# Patient Record
Sex: Male | Born: 1970 | Race: White | Hispanic: No | Marital: Single | Smoking: Former smoker
Health system: Southern US, Community
[De-identification: ages and names within clinical notes are randomized; demographics above are authoritative.]

## PROBLEM LIST (undated history)

## (undated) DIAGNOSIS — J45909 Unspecified asthma, uncomplicated: Secondary | ICD-10-CM

## (undated) DIAGNOSIS — I499 Cardiac arrhythmia, unspecified: Secondary | ICD-10-CM

## (undated) DIAGNOSIS — I498 Other specified cardiac arrhythmias: Secondary | ICD-10-CM

## (undated) HISTORY — DX: Unspecified asthma, uncomplicated: J45.909

## (undated) HISTORY — DX: Cardiac arrhythmia, unspecified: I49.9

## (undated) HISTORY — DX: Other specified cardiac arrhythmias: I49.8

---

## 2017-11-24 ENCOUNTER — Ambulatory Visit (INDEPENDENT_AMBULATORY_CARE_PROVIDER_SITE_OTHER): Payer: Self-pay | Admitting: Sports Medicine

## 2017-11-24 ENCOUNTER — Encounter: Payer: Self-pay | Admitting: Sports Medicine

## 2017-11-24 DIAGNOSIS — J45909 Unspecified asthma, uncomplicated: Secondary | ICD-10-CM | POA: Insufficient documentation

## 2017-11-24 DIAGNOSIS — I498 Other specified cardiac arrhythmias: Secondary | ICD-10-CM | POA: Insufficient documentation

## 2017-11-24 DIAGNOSIS — Z0000 Encounter for general adult medical examination without abnormal findings: Secondary | ICD-10-CM | POA: Insufficient documentation

## 2017-11-24 DIAGNOSIS — R0989 Other specified symptoms and signs involving the circulatory and respiratory systems: Secondary | ICD-10-CM

## 2017-11-24 DIAGNOSIS — I499 Cardiac arrhythmia, unspecified: Secondary | ICD-10-CM

## 2017-11-24 DIAGNOSIS — J989 Respiratory disorder, unspecified: Secondary | ICD-10-CM

## 2017-11-24 NOTE — Assessment & Plan Note (Signed)
Checking routine labs.  Unremarkable physical.

## 2017-11-24 NOTE — Assessment & Plan Note (Signed)
Historical, decreased caffeine intake and life stressors and this resolved. Did have an echocardiogram showing a structurally normal heart, off of metoprolol now.

## 2017-11-24 NOTE — Assessment & Plan Note (Signed)
Typically irritant related, has some albuterol for use as needed.

## 2017-11-24 NOTE — Progress Notes (Signed)
Subjective:    CC: New patient visit with the below complaints as noted in HPI:  HPI:  Samuel Fox is a pleasant 47 year old male, he is here to establish care, he really has no complaints.  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  He was noted to have a structurally normal heart on echocardiogram, and symptoms resolved with decreasing stimulant intake.  He has occasional irritant reactive airways and uses albuterol rarely, otherwise no complaints.  I reviewed the past medical history, family history, social history, surgical history, and allergies today and no changes were needed.  Please see the problem list section below in epic for further details.  Past Medical History: Past Medical History:  Diagnosis Date  . Asthma   . Ventricular bigeminy    Past Surgical History: History reviewed. No pertinent surgical history. Social History: Social History   Socioeconomic History  . Marital status: Single    Spouse name: Not on file  . Number of children: Not on file  . Years of education: Not on file  . Highest education level: Not on file  Occupational History  . Not on file  Social Needs  . Financial resource strain: Not on file  . Food insecurity:    Worry: Not on file    Inability: Not on file  . Transportation needs:    Medical: Not on file    Non-medical: Not on file  Tobacco Use  . Smoking status: Former Games developermoker  . Smokeless tobacco: Never Used  Substance and Sexual Activity  . Alcohol use: Yes  . Drug use: Never  . Sexual activity: Yes  Lifestyle  . Physical activity:    Days per week: Not on file    Minutes per session: Not on file  . Stress: Not on file  Relationships  . Social connections:    Talks on phone: Not on file    Gets together: Not on file    Attends religious service: Not on file    Active member of club or organization: Not on file    Attends meetings of clubs or organizations:  Not on file    Relationship status: Not on file  Other Topics Concern  . Not on file  Social History Narrative  . Not on file   Family History: No family history on file. Allergies: No Known Allergies Medications: See med rec.  Review of Systems: No headache, visual changes, nausea, vomiting, diarrhea, constipation, dizziness, abdominal pain, skin rash, fevers, chills, night sweats, swollen lymph nodes, weight loss, chest pain, body aches, joint swelling, muscle aches, shortness of breath, mood changes, visual or auditory hallucinations.  Objective:    General: Well Developed, well nourished, and in no acute distress.  Neuro: Alert and oriented x3, extra-ocular muscles intact, sensation grossly intact. Cranial nerves II through XII are intact, motor, sensory, and coordinative functions are all intact. HEENT: Normocephalic, atraumatic, pupils equal round reactive to light, neck supple, no masses, no lymphadenopathy, thyroid nonpalpable. Oropharynx, nasopharynx, external ear canals are unremarkable. Skin: Warm and dry, no rashes noted.  Cardiac: Regular rate and rhythm, no murmurs rubs or gallops.  Respiratory: Clear to auscultation bilaterally. Not using accessory muscles, speaking in full sentences.  Abdominal: Soft, nontender, nondistended, positive bowel sounds, no masses, no organomegaly.  Musculoskeletal: Shoulder, elbow, wrist, hip, knee, ankle stable, and with full range of motion.  Impression and Recommendations:    The patient was counselled, risk factors were  discussed, anticipatory guidance given.  History of Ventricular bigeminy Historical, decreased caffeine intake and life stressors and this resolved. Did have an echocardiogram showing a structurally normal heart, off of metoprolol now.  Annual physical exam Checking routine labs.  Unremarkable physical.  Reactive airway disease that is not asthma Typically irritant related, has some albuterol for use as  needed. ___________________________________________ Ihor Austinhomas J. Benjamin Stainhekkekandam, M.D., ABFM., CAQSM. Primary Care and Sports Medicine [facility]  Adjunct Instructor of Family Medicine  [employer]

## 2018-02-13 ENCOUNTER — Other Ambulatory Visit: Payer: Self-pay | Admitting: *Deleted

## 2018-02-13 ENCOUNTER — Other Ambulatory Visit: Payer: Self-pay | Admitting: Sports Medicine

## 2018-04-22 ENCOUNTER — Other Ambulatory Visit: Payer: Self-pay | Admitting: Sports Medicine

## 2018-05-11 ENCOUNTER — Ambulatory Visit (INDEPENDENT_AMBULATORY_CARE_PROVIDER_SITE_OTHER): Payer: BLUE CROSS/BLUE SHIELD | Admitting: Physician Assistant

## 2018-05-11 ENCOUNTER — Encounter: Payer: Self-pay | Admitting: Physician Assistant

## 2018-05-11 VITALS — BP 144/91 | HR 66 | Wt 189.0 lb

## 2018-05-11 DIAGNOSIS — Z20818 Contact with and (suspected) exposure to other bacterial communicable diseases: Secondary | ICD-10-CM

## 2018-05-11 NOTE — Progress Notes (Signed)
HPI:                                                                Samuel Fox is a 48 y.o. male who presents to [facility]: Primary Care Sports Medicine today for exposure to MRSA  Reports friend was diagnosed with MRSA skin infection of abscess his neck. He states he had close contact with this friend, including contact with his bandages. He denies any symptoms.   Past Medical History:  Diagnosis Date  . Asthma   . Ventricular bigeminy    No past surgical history on file. Social History   Tobacco Use  . Smoking status: Former Games developer  . Smokeless tobacco: Never Used  Substance Use Topics  . Alcohol use: Yes   family history is not on file.    ROS: Review of Systems  Constitutional: Negative for chills, fever and malaise/fatigue.  Skin: Negative for rash.  All other systems reviewed and are negative.    Medications: Current Outpatient Medications  Medication Sig Dispense Refill  . albuterol (PROVENTIL HFA;VENTOLIN HFA) 108 (90 Base) MCG/ACT inhaler INHALE 2 PUFFS EVERY 6 HRS AS NEEDED FOR WHEEZING OR SHORTNESS OF BREATH. 8.5 Inhaler 2  . b complex vitamins tablet Take 1 tablet by mouth daily.    . Misc Natural Products (GLUCOSAMINE CHOND MSM FORMULA PO) Take by mouth.    . Multiple Vitamin (MULTIVITAMIN) tablet Take 1 tablet by mouth daily.     No current facility-administered medications for this visit.    No Known Allergies     Objective:  BP (!) 144/91   Pulse 66   Wt 189 lb (85.7 kg)   BMI 25.63 kg/m  Gen:  alert, not ill-appearing, no distress, appropriate for age   No results found for this or any previous visit (from the past 72 hour(s)). No results found.    Assessment and Plan: 48 y.o. male with   .Diagnoses and all orders for this visit:  Exposure to MRSA  Asymptomatic Reassurance Counseled on infection prevention   Patient education and anticipatory guidance given Patient agrees with treatment  plan Follow-up as needed if symptoms worsen or fail to improve  Levonne Hubert PA-C

## 2018-05-17 ENCOUNTER — Encounter: Payer: Self-pay | Admitting: Physician Assistant

## 2018-06-04 ENCOUNTER — Ambulatory Visit: Payer: BLUE CROSS/BLUE SHIELD | Admitting: Sports Medicine

## 2018-06-04 ENCOUNTER — Ambulatory Visit (INDEPENDENT_AMBULATORY_CARE_PROVIDER_SITE_OTHER): Payer: BLUE CROSS/BLUE SHIELD

## 2018-06-04 ENCOUNTER — Encounter: Payer: Self-pay | Admitting: Sports Medicine

## 2018-06-04 DIAGNOSIS — M25562 Pain in left knee: Secondary | ICD-10-CM | POA: Insufficient documentation

## 2018-06-04 DIAGNOSIS — G8929 Other chronic pain: Secondary | ICD-10-CM | POA: Diagnosis not present

## 2018-06-04 DIAGNOSIS — M170 Bilateral primary osteoarthritis of knee: Secondary | ICD-10-CM | POA: Diagnosis not present

## 2018-06-04 DIAGNOSIS — L409 Psoriasis, unspecified: Secondary | ICD-10-CM | POA: Diagnosis not present

## 2018-06-04 MED ORDER — CLOBETASOL PROPIONATE 0.05 % EX CREA: 1.0000 "application " | TOPICAL_CREAM | Freq: Two times a day (BID) | CUTANEOUS | 2 refills | Status: DC

## 2018-06-04 NOTE — Progress Notes (Addendum)
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  Several months ago he twisted his knee, he had swelling, and pain at the medial joint line.  He wore a brace, iced it and took some NSAIDs and symptoms improved.  Now he gets occasional catching, locking, pain and swelling.  Does not really trust the knee.  Symptoms are medial joint line, localized without radiation.  In addition he has had a rash on his anterior left shin, it has been on his elbows.  Highly pruritic.  No pain.  I reviewed the past medical history, family history, social history, surgical history, and allergies today and no changes were needed.  Please see the problem list section below in epic for further details.  Past Medical History: Past Medical History:  Diagnosis Date  . Asthma   . Ventricular bigeminy    Past Surgical History: No past surgical history on file. Social History: Social History   Socioeconomic History  . Marital status: Single    Spouse name: Not on file  . Number of children: Not on file  . Years of education: Not on file  . Highest education level: Not on file  Occupational History  . Not on file  Social Needs  . Financial resource strain: Not on file  . Food insecurity:    Worry: Not on file    Inability: Not on file  . Transportation needs:    Medical: Not on file    Non-medical: Not on file  Tobacco Use  . Smoking status: Former Games developer  . Smokeless tobacco: Never Used  Substance and Sexual Activity  . Alcohol use: Yes  . Drug use: Never  . Sexual activity: Yes  Lifestyle  . Physical activity:    Days per week: Not on file    Minutes per session: Not on file  . Stress: Not on file  Relationships  . Social connections:    Talks on phone: Not on file    Gets together: Not on file    Attends religious service: Not on file    Active member of club or organization: Not on file    Attends meetings of clubs  or organizations: Not on file    Relationship status: Not on file  Other Topics Concern  . Not on file  Social History Narrative  . Not on file   Family History: No family history on file. Allergies: No Known Allergies Medications: See med rec.  Review of Systems: No fevers, chills, night sweats, weight loss, chest pain, or shortness of breath.   Objective:    General: Well Developed, well nourished, and in no acute distress.  Neuro: Alert and oriented x3, extra-ocular muscles intact, sensation grossly intact.  HEENT: Normocephalic, atraumatic, pupils equal round reactive to light, neck supple, no masses, no lymphadenopathy, thyroid nonpalpable.  Skin: Warm and dry, psoriasic form rash on the left anterior shin. Cardiac: Regular rate and rhythm, no murmurs rubs or gallops, no lower extremity edema.  Respiratory: Clear to auscultation bilaterally. Not using accessory muscles, speaking in full sentences. Left knee: Normal to inspection with no erythema or effusion or obvious bony abnormalities. Palpation normal with no warmth or joint line tenderness or patellar tenderness or condyle tenderness. ROM normal in flexion and extension and lower leg rotation. Pain with terminal flexion Ligaments with solid consistent endpoints including ACL, PCL, LCL, MCL. Positive Mcmurray's and provocative meniscal tests. Non  painful patellar compression. Patellar and quadriceps tendons unremarkable. Hamstring and quadriceps strength is normal.  Impression and Recommendations:    Left medial knee pain Suspect medial meniscal tear. X-rays, MRI. Questioning he has injected his own knee with horse steroid in the past without much improvement.  MRI shows large medial meniscal tear, para meniscal cyst. Referral to Dr. Everardo Pacific.  He has injected his own knee in an unguided fashion with horse steroid in the recent past without any improvement.  Psoriasis Adding topical clobetasol, if insufficient  relief we will switch to calcipotriene. ___________________________________________ Ihor Austin. Benjamin Stain, M.D., ABFM., CAQSM. Primary Care and Sports Medicine [facility]  Adjunct Professor of Family Medicine  [employer]

## 2018-06-04 NOTE — Assessment & Plan Note (Signed)
Adding topical clobetasol, if insufficient relief we will switch to calcipotriene.

## 2018-06-04 NOTE — Assessment & Plan Note (Addendum)
Suspect medial meniscal tear. X-rays, MRI. Questioning he has injected his own knee with horse steroid in the past without much improvement.  MRI shows large medial meniscal tear, para meniscal cyst. Referral to Dr. Everardo Pacific.  He has injected his own knee in an unguided fashion with horse steroid in the recent past without any improvement.

## 2018-06-12 ENCOUNTER — Ambulatory Visit: Payer: BLUE CROSS/BLUE SHIELD | Admitting: Sports Medicine

## 2018-06-12 ENCOUNTER — Encounter: Payer: Self-pay | Admitting: Sports Medicine

## 2018-06-12 DIAGNOSIS — N529 Male erectile dysfunction, unspecified: Secondary | ICD-10-CM | POA: Diagnosis not present

## 2018-06-12 DIAGNOSIS — G8929 Other chronic pain: Secondary | ICD-10-CM

## 2018-06-12 DIAGNOSIS — R51 Headache: Secondary | ICD-10-CM | POA: Diagnosis not present

## 2018-06-12 DIAGNOSIS — R519 Headache, unspecified: Secondary | ICD-10-CM

## 2018-06-12 DIAGNOSIS — H9312 Tinnitus, left ear: Secondary | ICD-10-CM

## 2018-06-12 DIAGNOSIS — H9319 Tinnitus, unspecified ear: Secondary | ICD-10-CM | POA: Insufficient documentation

## 2018-06-12 DIAGNOSIS — H6121 Impacted cerumen, right ear: Secondary | ICD-10-CM | POA: Insufficient documentation

## 2018-06-12 MED ORDER — SILDENAFIL CITRATE 50 MG PO TABS: 50.0000 mg | ORAL_TABLET | Freq: Every day | ORAL | 11 refills | Status: DC | PRN

## 2018-06-12 MED ORDER — FLUTICASONE PROPIONATE 50 MCG/ACT NA SUSP: NASAL | 3 refills | Status: DC

## 2018-06-12 NOTE — Assessment & Plan Note (Signed)
Removal with instrumentation.

## 2018-06-12 NOTE — Assessment & Plan Note (Signed)
Severe headache with left-sided tinnitus. Adding a brain MRI with and without contrast, need to rule out acoustic neuroma. I am also going to add some Flonase and get a referral to ENT.

## 2018-06-12 NOTE — Progress Notes (Signed)
Subjective:    CC: Several issues  HPI: Tinnitus: Has been complaining of a relatively long history of tinnitus in his left ear, associated headaches.  No trauma, no constitutional symptoms.  He has never had brain imaging.  No focal neurologic symptoms, no visual changes, no dysarthria.  Hearing loss in the right ear: Would like me to take a look.  Erectile dysfunction: As the years past he has noted decreases in both the quality and duration of his erections.'s are moderate, persistent.  I reviewed the past medical history, family history, social history, surgical history, and allergies today and no changes were needed.  Please see the problem list section below in epic for further details.  Past Medical History: Past Medical History:  Diagnosis Date  . Asthma   . Ventricular bigeminy    Past Surgical History: No past surgical history on file. Social History: Social History   Socioeconomic History  . Marital status: Single    Spouse name: Not on file  . Number of children: Not on file  . Years of education: Not on file  . Highest education level: Not on file  Occupational History  . Not on file  Social Needs  . Financial resource strain: Not on file  . Food insecurity:    Worry: Not on file    Inability: Not on file  . Transportation needs:    Medical: Not on file    Non-medical: Not on file  Tobacco Use  . Smoking status: Former Games developer  . Smokeless tobacco: Never Used  Substance and Sexual Activity  . Alcohol use: Yes  . Drug use: Never  . Sexual activity: Yes  Lifestyle  . Physical activity:    Days per week: Not on file    Minutes per session: Not on file  . Stress: Not on file  Relationships  . Social connections:    Talks on phone: Not on file    Gets together: Not on file    Attends religious service: Not on file    Active member of club or organization: Not on file    Attends meetings of clubs or organizations: Not on file    Relationship status:  Not on file  Other Topics Concern  . Not on file  Social History Narrative  . Not on file   Family History: No family history on file. Allergies: No Known Allergies Medications: See med rec.  Review of Systems: No fevers, chills, night sweats, weight loss, chest pain, or shortness of breath.   Objective:    General: Well Developed, well nourished, and in no acute distress.  Neuro: Alert and oriented x3, extra-ocular muscles intact, sensation grossly intact.  HEENT: Normocephalic, atraumatic, pupils equal round reactive to light, neck supple, no masses, no lymphadenopathy, thyroid nonpalpable.  Oropharynx, nasopharynx, ear canals unremarkable with the exception of the right ear canal which has an impacted piece of cerumen. Skin: Warm and dry, no rashes. Cardiac: Regular rate and rhythm, no murmurs rubs or gallops, no lower extremity edema.  Respiratory: Clear to auscultation bilaterally. Not using accessory muscles, speaking in full sentences.  Indication: Cerumen impaction of the right ear(s) Medical necessity statement: On physical examination, cerumen impairs clinically significant portions of the external auditory canal, and tympanic membrane. Noted obstructive, copious cerumen that cannot be removed without magnification and instrumentations requiring physician skills Consent: Discussed benefits and risks of procedure and verbal consent obtained Procedure: Patient was prepped for the procedure. Utilized an otoscope to assess and take  note of the ear canal, the tympanic membrane, and the presence, amount, and placement of the cerumen. Soft plastic curette was utilized to remove cerumen.  Post procedure examination: shows cerumen was completely removed. Patient tolerated procedure well. The patient is made aware that they may experience temporary vertigo, temporary hearing loss, and temporary discomfort. If these symptom last for more than 24 hours to call the clinic or proceed to the  ED.  Impression and Recommendations:    Tinnitus Severe headache with left-sided tinnitus. Adding a brain MRI with and without contrast, need to rule out acoustic neuroma. I am also going to add some Flonase and get a referral to ENT.  Hearing loss due to cerumen impaction, right Removal with instrumentation.  Erectile dysfunction Starting Viagra with a discount coupon. ___________________________________________ Ihor Austin. Benjamin Stain, M.D., ABFM., CAQSM. Primary Care and Sports Medicine [facility]  Adjunct Professor of Family Medicine  [employer]

## 2018-06-12 NOTE — Assessment & Plan Note (Signed)
Starting Viagra with a discount coupon.

## 2018-06-22 ENCOUNTER — Ambulatory Visit (INDEPENDENT_AMBULATORY_CARE_PROVIDER_SITE_OTHER): Payer: BLUE CROSS/BLUE SHIELD

## 2018-06-22 ENCOUNTER — Encounter: Payer: Self-pay | Admitting: Sports Medicine

## 2018-06-22 DIAGNOSIS — R51 Headache: Secondary | ICD-10-CM | POA: Diagnosis not present

## 2018-06-22 DIAGNOSIS — H9312 Tinnitus, left ear: Secondary | ICD-10-CM | POA: Diagnosis not present

## 2018-06-22 DIAGNOSIS — M23222 Derangement of posterior horn of medial meniscus due to old tear or injury, left knee: Secondary | ICD-10-CM | POA: Diagnosis not present

## 2018-06-22 DIAGNOSIS — G8929 Other chronic pain: Secondary | ICD-10-CM

## 2018-06-22 DIAGNOSIS — M25562 Pain in left knee: Secondary | ICD-10-CM

## 2018-06-22 MED ORDER — GADOBENATE DIMEGLUMINE 529 MG/ML IV SOLN
17.0000 mL | Freq: Once | INTRAVENOUS | Status: AC | PRN
  Administered 2018-06-22: 17 mL via INTRAVENOUS

## 2018-06-22 NOTE — Telephone Encounter (Signed)
Patient has [facility] so I am changing his referrals to go to [facility] that way they will be in Network - [facility]

## 2018-06-22 NOTE — Progress Notes (Signed)
Pt has seen results on MyChart and message also sent for patient to call back if any questions.

## 2018-06-22 NOTE — Addendum Note (Signed)
Addended by: Monica Becton on: 06/22/2018 01:43 PM   Modules accepted: Orders

## 2018-08-15 ENCOUNTER — Other Ambulatory Visit: Payer: Self-pay | Admitting: Sports Medicine

## 2018-08-25 ENCOUNTER — Other Ambulatory Visit: Payer: Self-pay | Admitting: Sports Medicine

## 2018-08-25 MED ORDER — ALBUTEROL SULFATE HFA 108 (90 BASE) MCG/ACT IN AERS: INHALATION_SPRAY | RESPIRATORY_TRACT | 2 refills | Status: DC

## 2019-04-15 ENCOUNTER — Other Ambulatory Visit: Payer: Self-pay | Admitting: Sports Medicine

## 2019-04-15 NOTE — Telephone Encounter (Signed)
Needs appointment

## 2019-05-07 ENCOUNTER — Other Ambulatory Visit: Payer: Self-pay | Admitting: Sports Medicine

## 2019-05-07 MED ORDER — ALBUTEROL SULFATE HFA 108 (90 BASE) MCG/ACT IN AERS: INHALATION_SPRAY | RESPIRATORY_TRACT | 0 refills | Status: DC

## 2019-05-07 NOTE — Telephone Encounter (Signed)
Must make appointment 

## 2019-08-07 ENCOUNTER — Other Ambulatory Visit: Payer: Self-pay | Admitting: Sports Medicine

## 2019-08-07 DIAGNOSIS — H9312 Tinnitus, left ear: Secondary | ICD-10-CM

## 2019-09-19 IMAGING — MR MRI HEAD WITHOUT AND WITH CONTRAST
11 of 12 series · 40 of 48 positions shown · IV contrast (17 ML GADAVIST)
Comparison: None.

CLINICAL DATA: Left ear ringing in, aching, and hearing loss for
1-2 years.

EXAM:
MRI HEAD WITHOUT AND WITH CONTRAST
TECHNIQUE: Multiplanar, multiecho pulse sequences of the brain and surrounding
structures were obtained without and with intravenous contrast.
CONTRAST:  17mL MULTIHANCE GADOBENATE DIMEGLUMINE 529 MG/ML IV SOLN

[Series 3: DWI · axial · 3.0mm · 1.20mm/px · z∈[-69,+93]mm · 8 of 55 slices shown]
[im 1/55]
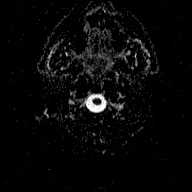
[im 8/55]
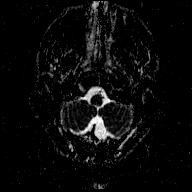
[im 16/55]
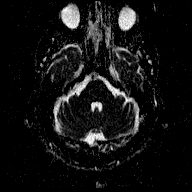
[im 24/55]
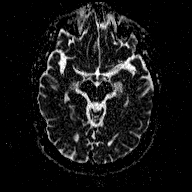
[im 31/55]
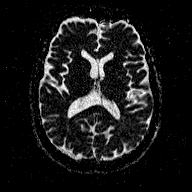
[im 39/55]
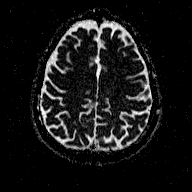
[im 47/55]
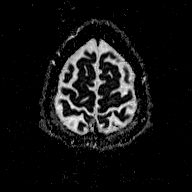
[im 55/55]
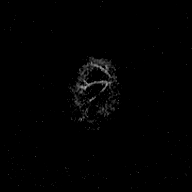

[Series 4: T1 · sagittal · 5.0mm · 0.45mm/px · 4 of 23 slices shown (1 of 3)]
[im 1/23]
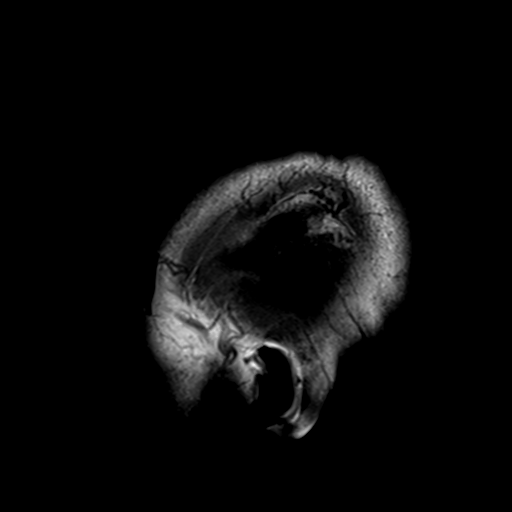
[im 8/23]
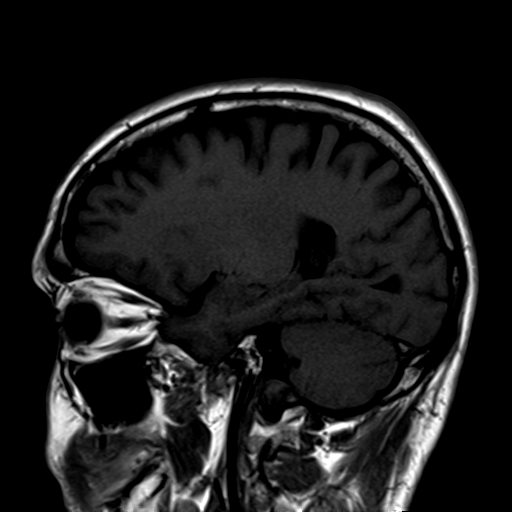
[im 15/23]
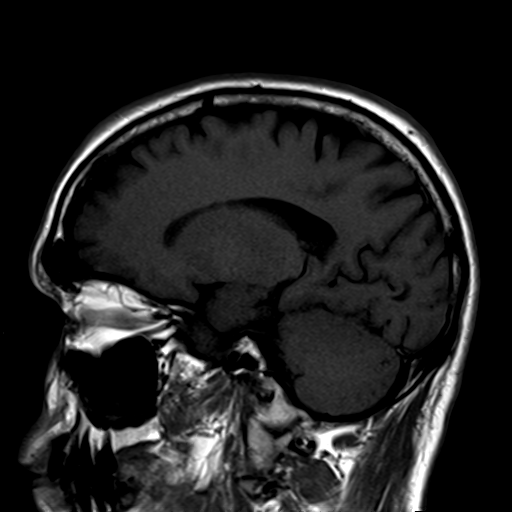
[im 23/23]
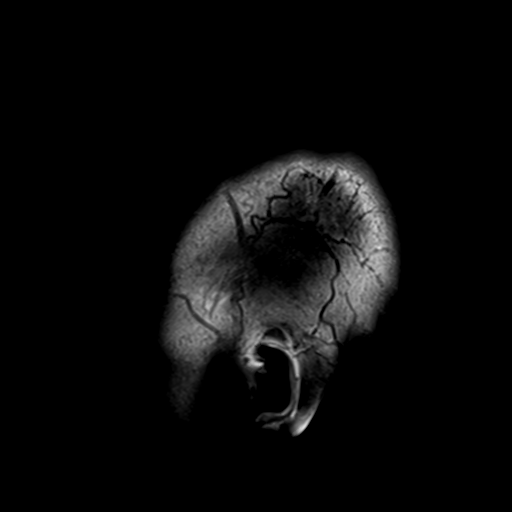

[Series 5: T2 · axial · 5.0mm · 0.72mm/px · z∈[-64,+91]mm · 3 of 25 slices shown (1 of 2)]
[im 1/25]
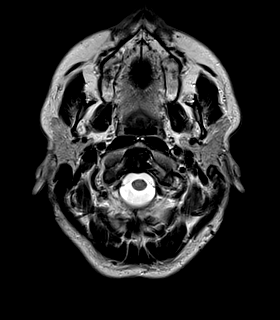
[im 13/25]
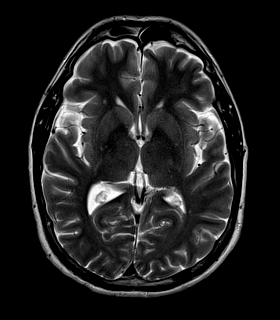
[im 25/25]
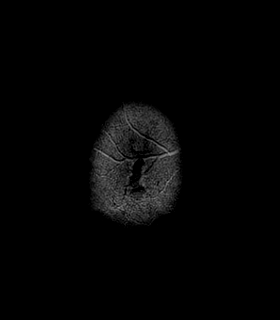

[Series 6: T2 · axial · 5.0mm · 0.72mm/px · z∈[-64,+91]mm · 3 of 25 slices shown (2 of 2)]
[im 1/25]
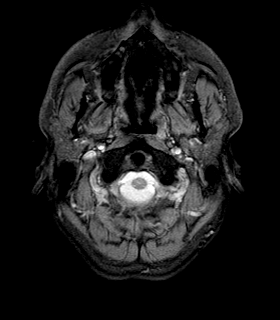
[im 13/25]
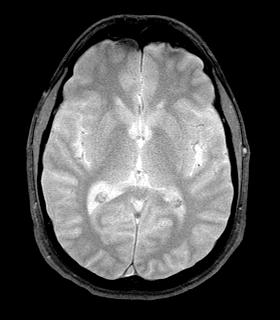
[im 25/25]
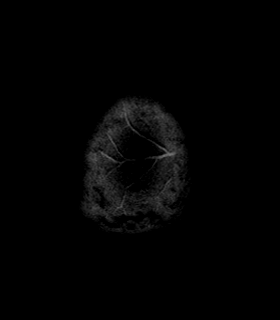

[Series 7: FLAIR · axial · 5.0mm · 0.45mm/px · z∈[-64,+91]mm · 3 of 25 slices shown]
[im 1/25]
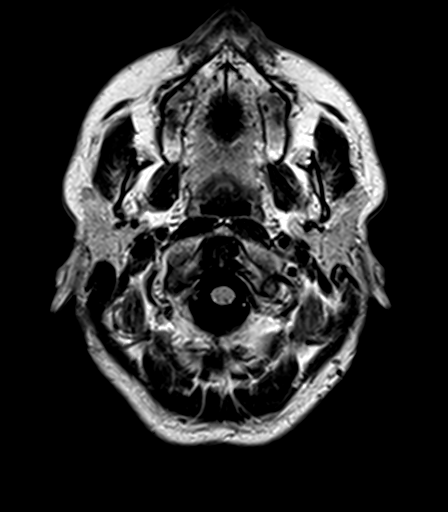
[im 13/25]
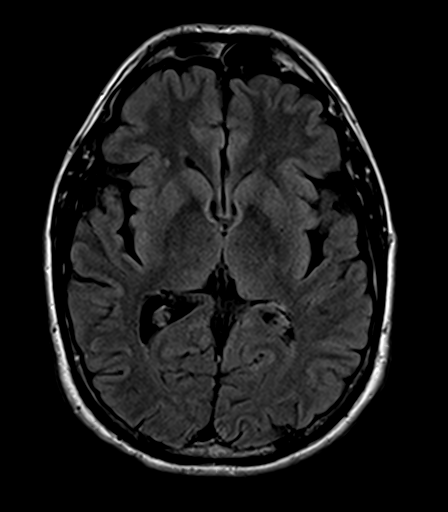
[im 25/25]
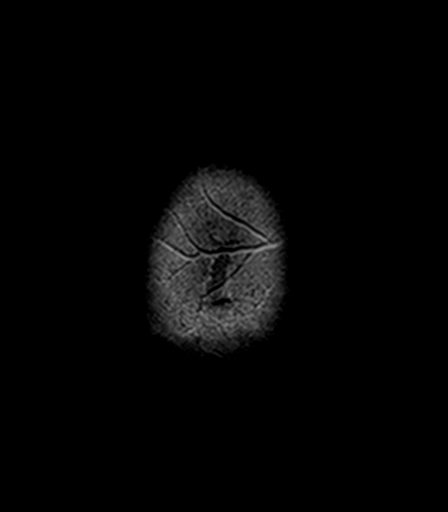

[Series 8: T1 · coronal · 3.0mm · 0.37mm/px · 1 of 11 slices shown (2 of 3)]
[im 1/11]
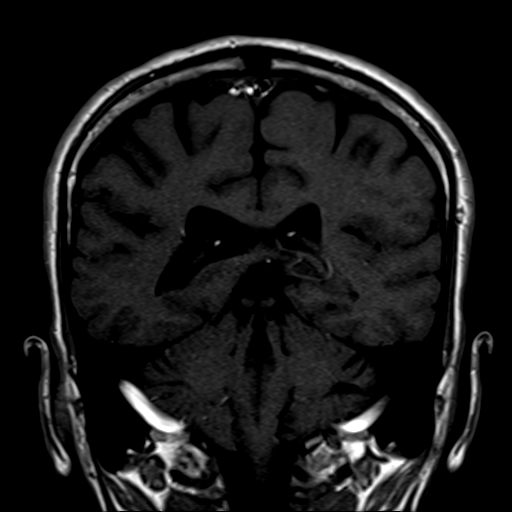

[Series 9: T1 · axial · 3.0mm · 0.37mm/px · 1 of 11 slices shown (3 of 3)]
[im 1/11]
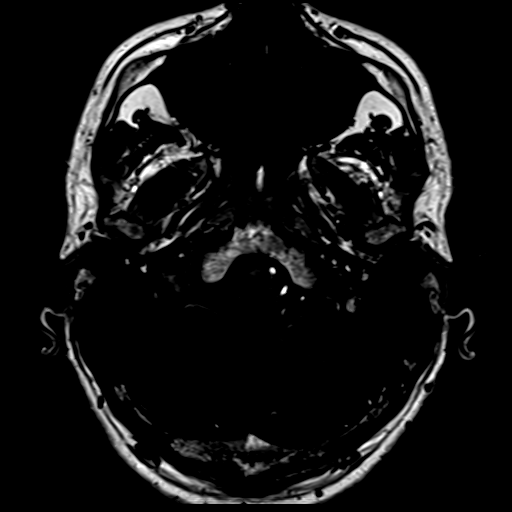

[Series 11: T1 post-contrast · axial · 3.0mm · 0.37mm/px · 1 of 11 slices shown (1 of 3)]
[im 1/11]
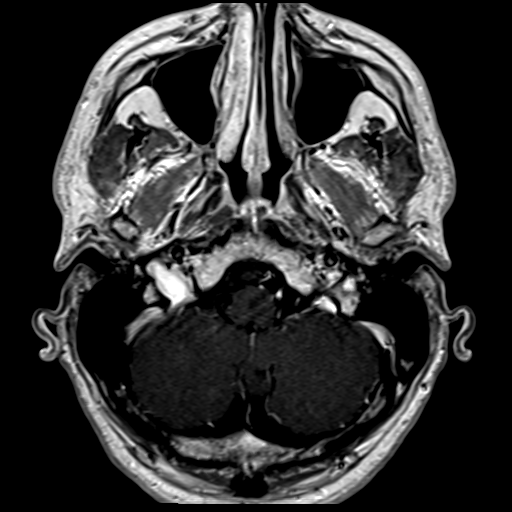

[Series 12: T1 post-contrast · coronal · 3.0mm · 0.37mm/px · 1 of 11 slices shown (2 of 3)]
[im 1/11]
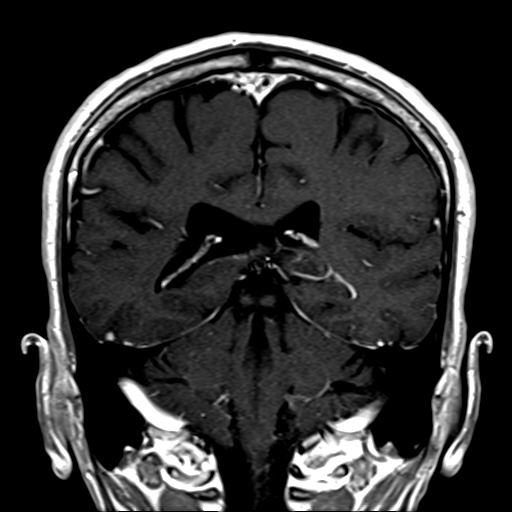

[Series 13: T1 post-contrast · axial · 3.0mm · 1.00mm/px · z∈[-80,+108]mm · 8 of 64 slices shown (3 of 3)]
[im 1/64]
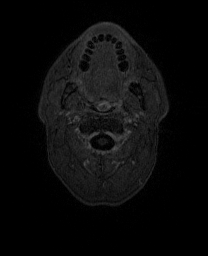
[im 10/64]
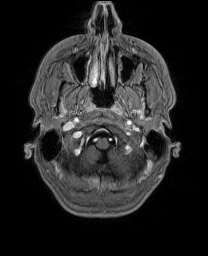
[im 19/64]
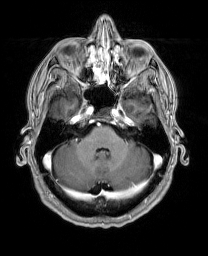
[im 28/64]
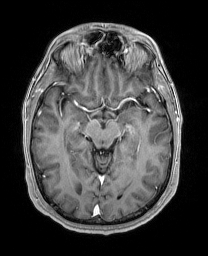
[im 37/64]
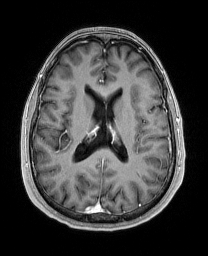
[im 46/64]
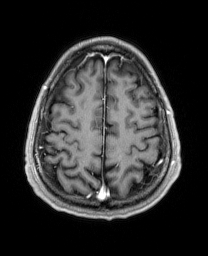
[im 55/64]
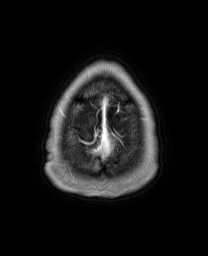
[im 64/64]
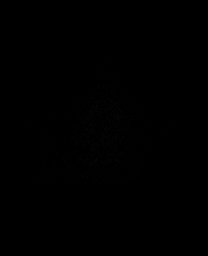

[Series 100: (id) ax · axial · 3.0mm · 1.20mm/px · z∈[-69,+93]mm · 7 of 55 slices shown]
[im 1/55]
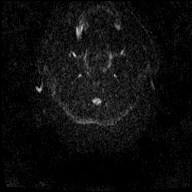
[im 10/55]
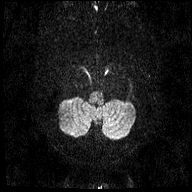
[im 19/55]
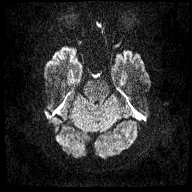
[im 28/55]
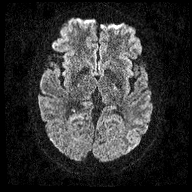
[im 37/55]
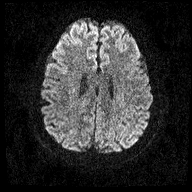
[im 46/55]
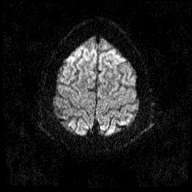
[im 55/55]
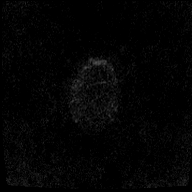

[40 of 48 positions shown; findings below may reference images not displayed]

FINDINGS: Brain: No acute infarct, hemorrhage, or mass lesion is present. No
significant white matter lesions are present. The ventricles are of
normal size. No significant extraaxial fluid collection is present.

Dedicated imaging of the internal auditory canals demonstrates no
pathologic enhancement in the IAC or inner ear structures.

High-resolution imaging demonstrates a discrete appearance of the
seventh and eighth cranial nerves. Inner ear structures demonstrate
normal fluid signal.

Postcontrast images demonstrate no pathologic enhancement.

Vascular: Flow is present in the major intracranial arteries.

Skull and upper cervical spine: The craniocervical junction is
normal. Upper cervical spine is within normal limits. Marrow signal
is unremarkable.

Sinuses/Orbits: Scattered mucosal thickening is present throughout
the paranasal sinuses. No fluid levels are present. Mastoid air
cells are clear. The globes and orbits are within normal limits.
IMPRESSION: 1. Normal MRI of the brain and IAC's without acute or focal lesion
to explain the patient's tinnitus, ear pain or hearing loss.
2. Mild diffuse sinus disease.

## 2019-11-01 DIAGNOSIS — Z20822 Contact with and (suspected) exposure to covid-19: Secondary | ICD-10-CM | POA: Diagnosis not present

## 2019-11-01 DIAGNOSIS — R519 Headache, unspecified: Secondary | ICD-10-CM | POA: Diagnosis not present

## 2019-11-01 DIAGNOSIS — J4541 Moderate persistent asthma with (acute) exacerbation: Secondary | ICD-10-CM | POA: Diagnosis not present

## 2019-11-01 DIAGNOSIS — R5383 Other fatigue: Secondary | ICD-10-CM | POA: Diagnosis not present

## 2019-12-28 DIAGNOSIS — R5383 Other fatigue: Secondary | ICD-10-CM | POA: Diagnosis not present

## 2019-12-28 DIAGNOSIS — Z20822 Contact with and (suspected) exposure to covid-19: Secondary | ICD-10-CM | POA: Diagnosis not present

## 2019-12-28 DIAGNOSIS — M791 Myalgia, unspecified site: Secondary | ICD-10-CM | POA: Diagnosis not present

## 2020-05-24 DIAGNOSIS — Z20822 Contact with and (suspected) exposure to covid-19: Secondary | ICD-10-CM | POA: Diagnosis not present

## 2020-05-24 DIAGNOSIS — Z03818 Encounter for observation for suspected exposure to other biological agents ruled out: Secondary | ICD-10-CM | POA: Diagnosis not present

## 2020-06-21 DIAGNOSIS — Z20822 Contact with and (suspected) exposure to covid-19: Secondary | ICD-10-CM | POA: Diagnosis not present

## 2020-06-21 DIAGNOSIS — R21 Rash and other nonspecific skin eruption: Secondary | ICD-10-CM | POA: Diagnosis not present

## 2020-09-01 DIAGNOSIS — L409 Psoriasis, unspecified: Secondary | ICD-10-CM | POA: Diagnosis not present

## 2020-09-01 DIAGNOSIS — J3089 Other allergic rhinitis: Secondary | ICD-10-CM | POA: Diagnosis not present

## 2020-09-01 DIAGNOSIS — Z8679 Personal history of other diseases of the circulatory system: Secondary | ICD-10-CM | POA: Diagnosis not present

## 2020-09-01 DIAGNOSIS — J4541 Moderate persistent asthma with (acute) exacerbation: Secondary | ICD-10-CM | POA: Diagnosis not present

## 2020-09-21 DIAGNOSIS — J4530 Mild persistent asthma, uncomplicated: Secondary | ICD-10-CM | POA: Diagnosis not present

## 2021-04-15 DIAGNOSIS — J45901 Unspecified asthma with (acute) exacerbation: Secondary | ICD-10-CM | POA: Diagnosis not present

## 2021-04-15 DIAGNOSIS — J019 Acute sinusitis, unspecified: Secondary | ICD-10-CM | POA: Diagnosis not present

## 2021-07-03 DIAGNOSIS — J0100 Acute maxillary sinusitis, unspecified: Secondary | ICD-10-CM | POA: Diagnosis not present

## 2021-07-03 DIAGNOSIS — B9789 Other viral agents as the cause of diseases classified elsewhere: Secondary | ICD-10-CM | POA: Diagnosis not present

## 2021-07-30 ENCOUNTER — Ambulatory Visit: Payer: BC Managed Care – PPO | Admitting: Sports Medicine

## 2021-07-30 VITALS — BP 125/80 | HR 70 | Ht 72.0 in | Wt 193.0 lb

## 2021-07-30 DIAGNOSIS — Z0000 Encounter for general adult medical examination without abnormal findings: Secondary | ICD-10-CM

## 2021-07-30 DIAGNOSIS — J4520 Mild intermittent asthma, uncomplicated: Secondary | ICD-10-CM | POA: Diagnosis not present

## 2021-07-30 DIAGNOSIS — L719 Rosacea, unspecified: Secondary | ICD-10-CM | POA: Diagnosis not present

## 2021-07-30 DIAGNOSIS — Z23 Encounter for immunization: Secondary | ICD-10-CM

## 2021-07-30 DIAGNOSIS — N139 Obstructive and reflux uropathy, unspecified: Secondary | ICD-10-CM

## 2021-07-30 DIAGNOSIS — Z1211 Encounter for screening for malignant neoplasm of colon: Secondary | ICD-10-CM

## 2021-07-30 DIAGNOSIS — I10 Essential (primary) hypertension: Secondary | ICD-10-CM

## 2021-07-30 MED ORDER — METRONIDAZOLE 0.75 % EX GEL: 1.0000 "application " | Freq: Two times a day (BID) | CUTANEOUS | 3 refills | Status: DC

## 2021-07-30 NOTE — Assessment & Plan Note (Signed)
Routine physical as above. ?Tdap and Shingrix No. 1 today. ?We will go ahead and get some routine labs. ?

## 2021-07-30 NOTE — Assessment & Plan Note (Addendum)
Josian continues to have reactive airways, potentially asthma. ?He does well with inhaled fluticasone daily and albuterol, he does have some triggers at work, we have discussed reducing occupational exposure with masking. ?Return to see me as needed for this. ?Sounds like he is going to need FMLA for this, he will drop off the paperwork and we can do a telephone visit to fill it out. ?

## 2021-07-30 NOTE — Addendum Note (Signed)
Addended by: Gaynelle Arabian on: 07/30/2021 01:37 PM ? ? Modules accepted: Orders ? ?

## 2021-07-30 NOTE — Progress Notes (Signed)
?  Subjective:   ? ?CC: Annual Physical Exam ? ?HPI:  ?This patient is here for their annual physical ? ?I reviewed the past medical history, family history, social history, surgical history, and allergies today and no changes were needed.  Please see the problem list section below in epic for further details. ? ?Past Medical History: ?Past Medical History:  ?Diagnosis Date  ? Asthma   ? Ventricular bigeminy   ? ?Past Surgical History: ?No past surgical history on file. ?Social History: ?Social History  ? ?Socioeconomic History  ? Marital status: Single  ?  Spouse name: Not on file  ? Number of children: Not on file  ? Years of education: Not on file  ? Highest education level: Not on file  ?Occupational History  ? Not on file  ?Tobacco Use  ? Smoking status: Former  ? Smokeless tobacco: Never  ?Substance and Sexual Activity  ? Alcohol use: Yes  ? Drug use: Never  ? Sexual activity: Yes  ?Other Topics Concern  ? Not on file  ?Social History Narrative  ? Not on file  ? ?Social Determinants of Health  ? ?Financial Resource Strain: Not on file  ?Food Insecurity: Not on file  ?Transportation Needs: Not on file  ?Physical Activity: Not on file  ?Stress: Not on file  ?Social Connections: Not on file  ? ?Family History: ?No family history on file. ?Allergies: ?No Known Allergies ?Medications: See med rec. ? ?Review of Systems: No headache, visual changes, nausea, vomiting, diarrhea, constipation, dizziness, abdominal pain, skin rash, fevers, chills, night sweats, swollen lymph nodes, weight loss, chest pain, body aches, joint swelling, muscle aches, shortness of breath, mood changes, visual or auditory hallucinations. ? ?Objective:   ? ?General: Well Developed, well nourished, and in no acute distress.  ?Neuro: Alert and oriented x3, extra-ocular muscles intact, sensation grossly intact. Cranial nerves II through XII are intact, motor, sensory, and coordinative functions are all intact. ?HEENT: Normocephalic, atraumatic,  pupils equal round reactive to light, neck supple, no masses, no lymphadenopathy, thyroid nonpalpable. Oropharynx, nasopharynx, external ear canals are unremarkable. ?Skin: Warm and dry, papulopustular rash across the nasal bridge and cheeks consistent with papulopustular rosacea. ?Cardiac: Regular rate and rhythm, no murmurs rubs or gallops.  ?Respiratory: Clear to auscultation bilaterally. Not using accessory muscles, speaking in full sentences.  ?Abdominal: Soft, nontender, nondistended, positive bowel sounds, no masses, no organomegaly.  ?Musculoskeletal: Shoulder, elbow, wrist, hip, knee, ankle stable, and with full range of motion. ? ?Impression and Recommendations:   ? ?The patient was counselled, risk factors were discussed, anticipatory guidance given. ? ?Annual physical exam ?Routine physical as above. ?Tdap and Shingrix No. 1 today. ?We will go ahead and get some routine labs. ? ?Reactive airway disease ?Samuel Fox continues to have reactive airways, potentially asthma. ?He does well with inhaled fluticasone daily and albuterol, he does have some triggers at work, we have discussed reducing occupational exposure with masking. ?Return to see me as needed for this. ?Sounds like he is going to need FMLA for this, he will drop off the paperwork and we can do a telephone visit to fill it out. ? ?Rosacea ?Adding topical metronidazole, will switch to [facility] if insufficient improvement. ? ?White coat syndrome with hypertension ?Elevated blood pressure in the office, normal at home. ? ? ?___________________________________________ ?Samuel Fox. Benjamin Stain, M.D., ABFM., CAQSM. ?Primary Care and Sports Medicine ?[facility] ? ?Adjunct Professor of Family Medicine  ?[employer] ?

## 2021-07-30 NOTE — Assessment & Plan Note (Signed)
Adding topical metronidazole, will switch to [facility] if insufficient improvement. ?

## 2021-07-30 NOTE — Assessment & Plan Note (Signed)
Elevated blood pressure in the office, normal at home. ?

## 2021-08-01 ENCOUNTER — Encounter: Payer: Self-pay | Admitting: Sports Medicine

## 2021-08-01 DIAGNOSIS — N139 Obstructive and reflux uropathy, unspecified: Secondary | ICD-10-CM | POA: Diagnosis not present

## 2021-08-01 DIAGNOSIS — I10 Essential (primary) hypertension: Secondary | ICD-10-CM | POA: Diagnosis not present

## 2021-08-02 LAB — CBC
HCT: 47.5 % (ref 38.5–50.0)
Hemoglobin: 15.9 g/dL (ref 13.2–17.1)
MCH: 31.5 pg (ref 27.0–33.0)
MCHC: 33.5 g/dL (ref 32.0–36.0)
MCV: 94.2 fL (ref 80.0–100.0)
MPV: 10.1 fL (ref 7.5–12.5)
Platelets: 247 10*3/uL (ref 140–400)
RBC: 5.04 10*6/uL (ref 4.20–5.80)
RDW: 12 % (ref 11.0–15.0)
WBC: 5 10*3/uL (ref 3.8–10.8)

## 2021-08-02 LAB — TSH: TSH: 1.39 mIU/L (ref 0.40–4.50)

## 2021-08-02 LAB — COMPREHENSIVE METABOLIC PANEL
AG Ratio: 1.4 (calc) (ref 1.0–2.5)
ALT: 31 U/L (ref 9–46)
AST: 23 U/L (ref 10–35)
Albumin: 4.1 g/dL (ref 3.6–5.1)
Alkaline phosphatase (APISO): 62 U/L (ref 35–144)
BUN: 13 mg/dL (ref 7–25)
CO2: 26 mmol/L (ref 20–32)
Calcium: 9.5 mg/dL (ref 8.6–10.3)
Chloride: 104 mmol/L (ref 98–110)
Creat: 0.84 mg/dL (ref 0.70–1.30)
Globulin: 2.9 g/dL (calc) (ref 1.9–3.7)
Glucose, Bld: 111 mg/dL — ABNORMAL HIGH (ref 65–99)
Potassium: 4.6 mmol/L (ref 3.5–5.3)
Sodium: 138 mmol/L (ref 135–146)
Total Bilirubin: 0.5 mg/dL (ref 0.2–1.2)
Total Protein: 7 g/dL (ref 6.1–8.1)

## 2021-08-02 LAB — LIPID PANEL
Cholesterol: 188 mg/dL (ref <200)
HDL: 72 mg/dL (ref >=40)
LDL Cholesterol (Calc): 99 mg/dL (calc)
Non-HDL Cholesterol (Calc): 116 mg/dL (calc) (ref <130)
Total CHOL/HDL Ratio: 2.6 (calc) (ref <5.0)
Triglycerides: 84 mg/dL (ref <150)

## 2021-08-02 LAB — PSA, TOTAL AND FREE
PSA, % Free: 33 % (calc) (ref >25)
PSA, Free: 0.3 ng/mL
PSA, Total: 0.9 ng/mL (ref <=4.0)

## 2021-08-02 NOTE — Telephone Encounter (Signed)
V.mail left for patient to return call and provide fax # to his HR dept. Also, stated in v.mail that technically this type of paperwork is not supposed to be faxed, as the patient needs to pick up in person. However, since it was already done yesterday, we would do it once more. ?

## 2021-08-03 NOTE — Telephone Encounter (Signed)
Patients paperwork faxed (as one time courtesy), uploaded to patient's chart and sent to scan! ?

## 2021-08-07 ENCOUNTER — Encounter: Payer: Self-pay | Admitting: Sports Medicine

## 2021-08-07 DIAGNOSIS — J4520 Mild intermittent asthma, uncomplicated: Secondary | ICD-10-CM

## 2021-08-07 MED ORDER — PROAIR DIGIHALER 108 (90 BASE) MCG/ACT IN AEPB: INHALATION_SPRAY | RESPIRATORY_TRACT | 11 refills | Status: DC

## 2021-08-07 NOTE — Telephone Encounter (Signed)
No idea, sounds fancier than it needs to be, sent, I guess we will expect a PA. ?

## 2021-08-22 DIAGNOSIS — Z1211 Encounter for screening for malignant neoplasm of colon: Secondary | ICD-10-CM | POA: Diagnosis not present

## 2021-09-01 LAB — COLOGUARD: COLOGUARD: NEGATIVE

## 2021-09-06 DIAGNOSIS — Z20822 Contact with and (suspected) exposure to covid-19: Secondary | ICD-10-CM | POA: Diagnosis not present

## 2021-09-06 DIAGNOSIS — R030 Elevated blood-pressure reading, without diagnosis of hypertension: Secondary | ICD-10-CM | POA: Diagnosis not present

## 2021-09-06 DIAGNOSIS — J069 Acute upper respiratory infection, unspecified: Secondary | ICD-10-CM | POA: Diagnosis not present

## 2021-09-27 ENCOUNTER — Telehealth: Payer: Self-pay

## 2021-09-27 NOTE — Telephone Encounter (Addendum)
Initiated Prior authorization TGG:YIRSWN Digihaler 108 (90 Base)MCG/ACT aerosol powder Via: Covermymeds Case/Key: [id number] Status: Denied as of 09/27/21 Reason:Generic is preferred  formulary exclusion  Notified Pt via: Mychart

## 2021-10-01 ENCOUNTER — Ambulatory Visit (INDEPENDENT_AMBULATORY_CARE_PROVIDER_SITE_OTHER): Payer: BC Managed Care – PPO | Admitting: Sports Medicine

## 2021-10-01 VITALS — Temp 98.1°F

## 2021-10-01 DIAGNOSIS — Z23 Encounter for immunization: Secondary | ICD-10-CM | POA: Diagnosis not present

## 2021-10-01 NOTE — Progress Notes (Signed)
Administration site: Left Deltoid  NDC: [id number]  LOT: [id number]  EXPIRES: 12/01/2022

## 2021-10-03 ENCOUNTER — Encounter: Payer: Self-pay | Admitting: Sports Medicine

## 2021-10-03 DIAGNOSIS — J4520 Mild intermittent asthma, uncomplicated: Secondary | ICD-10-CM

## 2021-10-03 MED ORDER — ALBUTEROL SULFATE HFA 108 (90 BASE) MCG/ACT IN AERS: 2.0000 | INHALATION_SPRAY | Freq: Four times a day (QID) | RESPIRATORY_TRACT | 11 refills | Status: DC | PRN

## 2021-11-05 ENCOUNTER — Encounter: Payer: Self-pay | Admitting: Medical-Surgical

## 2021-11-05 ENCOUNTER — Ambulatory Visit (INDEPENDENT_AMBULATORY_CARE_PROVIDER_SITE_OTHER): Payer: BC Managed Care – PPO | Admitting: Medical-Surgical

## 2021-11-05 VITALS — BP 144/107 | HR 80 | Temp 99.4°F | Ht 72.0 in | Wt 192.0 lb

## 2021-11-05 DIAGNOSIS — H6690 Otitis media, unspecified, unspecified ear: Secondary | ICD-10-CM | POA: Diagnosis not present

## 2021-11-05 DIAGNOSIS — J029 Acute pharyngitis, unspecified: Secondary | ICD-10-CM | POA: Diagnosis not present

## 2021-11-05 LAB — POCT RAPID STREP A (OFFICE): Rapid Strep A Screen: NEGATIVE

## 2021-11-05 MED ORDER — AMOXICILLIN-POT CLAVULANATE 875-125 MG PO TABS: 1.0000 | ORAL_TABLET | Freq: Two times a day (BID) | ORAL | 0 refills | Status: DC

## 2021-11-05 NOTE — Progress Notes (Signed)
Established Patient Office Visit  Subjective   Patient ID: Samuel Fox, male   DOB: Apr 15, 1971 Age: 51 y.o. MRN: [id number]   Chief Complaint  Patient presents with   Sore Throat    HPI Pleasant 51 year old male presenting today for evaluation of a sore throat that started on Friday.  He developed painful swallowing which lasted through till Saturday.  He has had a low-grade fever on Friday and Saturday and notes he had a bit of a low-grade temperature today.  He has a lump under his throat on the right side that he noticed on Friday that is now bigger and the size of a golf ball.  Has some right ear pressure and deep itching.  He has used an ice pack on the lump in his throat which was helpful but has not tried any other medications or home remedies.  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  He has had some vomiting due to feeling like there is something stuck in his throat but no other nausea or diarrhea.   Objective:    Vitals:   11/05/21 1028  BP: (!) 144/107  Pulse: 80  Temp: 99.4 F (37.4 C)  Height: 6' (1.829 m)  Weight: 192 lb (87.1 kg)  SpO2: 99%  TempSrc: Oral  BMI (Calculated): 26.03   Physical Exam Vitals reviewed.  Constitutional:      General: He is not in acute distress.    Appearance: Normal appearance. He is obese. He is not ill-appearing.  HENT:     Head: Normocephalic.     Right Ear: Ear canal normal.     Left Ear: Ear canal normal.     Ears:     Comments: Bilateral external ear canals erythematous, injected.  Left TM with middle ear effusion, clear.  Right TM cloudy, dull.    Mouth/Throat:     Pharynx: Posterior oropharyngeal erythema present. No oropharyngeal exudate.     Tonsils: No tonsillar exudate.  Cardiovascular:     Rate and Rhythm: Normal rate.     Pulses: Normal pulses.     Heart sounds: Normal heart sounds. No murmur heard.    No friction rub. No gallop.   Pulmonary:     Effort: Pulmonary effort is normal. No respiratory distress.     Breath sounds: Normal breath sounds.  Lymphadenopathy:     Cervical: Cervical adenopathy (Right submandibular) present.  Skin:    General: Skin is warm and dry.  Neurological:     Mental Status: He is alert and oriented to person, place, and time.  Psychiatric:        Mood and Affect: Mood normal.        Behavior: Behavior normal.        Thought Content: Thought content normal.        Judgment: Judgment normal.    Results for orders placed or performed in visit on 11/05/21 (from the past 24 hour(s))  POCT rapid strep A     Status: Abnormal   Collection Time: 11/05/21 10:51 AM  Result Value Ref Range   Rapid Strep A Screen Negative Negative       The 10-year ASCVD risk score (Arnett DK, et al., 2019) is: 2.7%   Values used to calculate the score:     Age: 64 years     Sex: Male     Is Non-Hispanic African American: No  Diabetic: No     Tobacco smoker: No     Systolic Blood Pressure: 144 mmHg     Is BP treated: No     HDL Cholesterol: 72 mg/dL     Total Cholesterol: 188 mg/dL   Assessment & Plan:   1. Sore throat Rapid strep negative. - POCT rapid strep A  2. Acute otitis media, unspecified otitis media type Augmentin twice daily x7 days.  Recommend conservative measures including salt water gargles, sore throat lozenges, etc.  Okay to use Tylenol and ibuprofen if desired for pain and fever.  Significant right adenopathy is suspicious for possible lymphadenitis but Augmentin should cover for this as well.  Work note provided at patient request.  Return if symptoms worsen or fail to improve.  ___________________________________________ Thayer Ohm, DNP, APRN, FNP-BC Primary Care and Sports Medicine [facility] [place]

## 2021-11-06 ENCOUNTER — Encounter: Payer: Self-pay | Admitting: Medical-Surgical

## 2021-11-06 ENCOUNTER — Encounter: Payer: Self-pay | Admitting: Sports Medicine

## 2021-11-07 ENCOUNTER — Ambulatory Visit: Payer: BC Managed Care – PPO | Admitting: Sports Medicine

## 2021-12-19 ENCOUNTER — Encounter: Payer: Self-pay | Admitting: Sports Medicine

## 2022-01-23 ENCOUNTER — Encounter: Payer: Self-pay | Admitting: Sports Medicine

## 2022-01-25 DIAGNOSIS — R030 Elevated blood-pressure reading, without diagnosis of hypertension: Secondary | ICD-10-CM | POA: Diagnosis not present

## 2022-01-25 DIAGNOSIS — U071 COVID-19: Secondary | ICD-10-CM | POA: Diagnosis not present

## 2022-01-29 ENCOUNTER — Telehealth: Payer: BC Managed Care – PPO | Admitting: Medical-Surgical

## 2022-01-29 ENCOUNTER — Encounter: Payer: Self-pay | Admitting: Medical-Surgical

## 2022-01-29 DIAGNOSIS — I10 Essential (primary) hypertension: Secondary | ICD-10-CM

## 2022-01-29 DIAGNOSIS — J0140 Acute pansinusitis, unspecified: Secondary | ICD-10-CM | POA: Diagnosis not present

## 2022-01-29 MED ORDER — AZITHROMYCIN 250 MG PO TABS: ORAL_TABLET | ORAL | 0 refills | Status: AC

## 2022-01-29 MED ORDER — LISINOPRIL 5 MG PO TABS: 5.0000 mg | ORAL_TABLET | Freq: Every day | ORAL | 3 refills | Status: DC

## 2022-01-29 NOTE — Progress Notes (Signed)
Virtual Visit via Video Note  I connected with Samuel Fox on 01/29/22 at  1:00 PM EDT by a video enabled telemedicine application and verified that I am speaking with the correct person using two identifiers.   I discussed the limitations of evaluation and management by telemedicine and the availability of in person appointments. The patient expressed understanding and agreed to proceed.  Patient location: home Provider locations: office  Subjective:    CC: sinus infection?  HPI: Very pleasant 51 year old male presenting via MyChart video visit today with reports of questionable sinus infection.  He was diagnosed with COVID last week and reports that his symptoms were mild with improvement on Saturday.  On Sunday morning, he thought that he was doing very well however that evening, his symptoms returned and have since worsened.  He is experiencing nasal congestion, rhinorrhea, postnasal drip, facial pressure affecting the forehead and cheeks, and continued taste changes.  He has had no fever for a few days.  Requesting a return to work note on 01/30/2022.  Reports that he used to be treated for hypertension with lisinopril 10 mg daily.  He was able to come off this and keep his blood pressure down with diet, exercise, and beet root powder.  He is not sure what changed but he feels that the beet root powder is no longer the quality that it was previously and his blood pressure started to creep up.  Lately he has seen readings in the 120s/80s and he saw a few readings last week in the 130s/90s.  He is interested in getting restarted on lisinopril at a low dose to have on hand.  He does have chest discomfort and shortness of breath but relates these to asthma rather than a cardiac issues.  Past medical history, Surgical history, Family history not pertinant except as noted below, Social history, Allergies, and medications have been entered into the medical record, reviewed, and corrections made.    Review of Systems: See HPI for pertinent positives and negatives.   Objective:    General: Speaking clearly in complete sentences without any shortness of breath.  Alert and oriented x3.  Normal judgment. No apparent acute distress.  Impression and Recommendations:    1. Acute non-recurrent pansinusitis Symptoms have persisted for about a week with initial improvement followed by worsening.  Treating with azithromycin 500 mg today then 250 mg daily for 4 days.  Offered a short course of steroids as well as cough medication but he would like to avoid these for now.  Work note provided via Clinical cytogeneticist.  2. White coat syndrome with hypertension Blood pressure elevated back in July and has seen a few elevated readings recently.  Discussed the effects on COVID with elevations in blood pressure.  He would still like to have the lisinopril on hand and plans to monitor his blood pressure closely.  Restarting lisinopril at 5 mg daily.  Advised to watch for hypotension and continue diet and exercise.  Once his symptoms settle down from COVID and his current sinus infection, he may find that he does not need the medication anymore.  Reviewed over-the-counter medications that can affect blood pressure readings.  I discussed the assessment and treatment plan with the patient. The patient was provided an opportunity to ask questions and all were answered. The patient agreed with the plan and demonstrated an understanding of the instructions.   The patient was advised to call back or seek an in-person evaluation if the symptoms worsen or if the  condition fails to improve as anticipated.  25 minutes of non-face-to-face time was provided during this encounter.  Return in about 3 months (around 05/01/2022) for HTN follow up.  Clearnce Sorrel, DNP, APRN, FNP-BC [facility]

## 2022-01-30 ENCOUNTER — Encounter: Payer: Self-pay | Admitting: Medical-Surgical

## 2022-01-31 NOTE — Telephone Encounter (Signed)
Okay to revise work note?

## 2022-02-02 ENCOUNTER — Encounter (INDEPENDENT_AMBULATORY_CARE_PROVIDER_SITE_OTHER): Payer: BC Managed Care – PPO | Admitting: Medical-Surgical

## 2022-02-02 DIAGNOSIS — J4520 Mild intermittent asthma, uncomplicated: Secondary | ICD-10-CM

## 2022-02-04 MED ORDER — AMOXICILLIN-POT CLAVULANATE 875-125 MG PO TABS: 1.0000 | ORAL_TABLET | Freq: Two times a day (BID) | ORAL | 0 refills | Status: DC

## 2022-02-04 NOTE — Telephone Encounter (Signed)
I spent 5 total minutes of online digital evaluation and management services in this patient-initiated request for online care. 

## 2022-02-21 ENCOUNTER — Ambulatory Visit: Payer: BC Managed Care – PPO | Admitting: Sports Medicine

## 2022-02-22 ENCOUNTER — Ambulatory Visit: Payer: BC Managed Care – PPO | Admitting: Sports Medicine

## 2022-03-21 DIAGNOSIS — J069 Acute upper respiratory infection, unspecified: Secondary | ICD-10-CM | POA: Diagnosis not present

## 2022-03-21 DIAGNOSIS — J029 Acute pharyngitis, unspecified: Secondary | ICD-10-CM | POA: Diagnosis not present

## 2022-03-21 DIAGNOSIS — R051 Acute cough: Secondary | ICD-10-CM | POA: Diagnosis not present

## 2022-03-21 DIAGNOSIS — R509 Fever, unspecified: Secondary | ICD-10-CM | POA: Diagnosis not present

## 2022-04-24 DIAGNOSIS — S0081XA Abrasion of other part of head, initial encounter: Secondary | ICD-10-CM | POA: Diagnosis not present

## 2022-05-02 ENCOUNTER — Ambulatory Visit: Payer: BC Managed Care – PPO | Admitting: Sports Medicine

## 2022-05-02 ENCOUNTER — Encounter: Payer: Self-pay | Admitting: Sports Medicine

## 2022-05-02 VITALS — BP 135/86 | HR 100

## 2022-05-02 DIAGNOSIS — S0592XA Unspecified injury of left eye and orbit, initial encounter: Secondary | ICD-10-CM

## 2022-05-02 MED ORDER — POLYMYXIN B-TRIMETHOPRIM 10000-0.1 UNIT/ML-% OP SOLN: 2.0000 [drp] | OPHTHALMIC | 0 refills | Status: DC

## 2022-05-02 NOTE — Assessment & Plan Note (Signed)
This is a very pleasant 52 year old male, approximately 9 days ago he was in bed, his cat swatted at his left eye impacting his cornea. He had immediate pain. His visual acuity was for the most part normal, maybe a bit blurry on the left. He was seen in the [facility] about a week ago, given a topical antibiotic and referred to ophthalmology, he did not go. He presents here today with similar symptoms, itchiness, foreign body sensation left eye. Today visual acuity is 20/20 right eye, 20/20 both eyes, 20/40 left eye. Eyes normal to examination, extraocular movements are intact. No proptosis, hyphema, or anterior chamber chemosis. I did a fluorescein exam today that was negative for any obvious signs of corneal abrasion. He was given topical moxifloxacin which he tells me is making his eye quite itchy, so we will discontinue this and switch to topical polymyxin/trimethoprim. He will continue patching for now, particular when sleeping.

## 2022-05-02 NOTE — Progress Notes (Signed)
    Procedures performed today:    None.  Independent interpretation of notes and tests performed by another provider:   None.  Brief History, Exam, Impression, and Recommendations:    Left eye injury This is a very pleasant 52 year old male, approximately 9 days ago he was in bed, his cat swatted at his left eye impacting his cornea. He had immediate pain. His visual acuity was for the most part normal, maybe a bit blurry on the left. He was seen in the [facility] about a week ago, given a topical antibiotic and referred to ophthalmology, he did not go. He presents here today with similar symptoms, itchiness, foreign body sensation left eye. Today visual acuity is 20/20 right eye, 20/20 both eyes, 20/40 left eye. Eyes normal to examination, extraocular movements are intact. No proptosis, hyphema, or anterior chamber chemosis. I did a fluorescein exam today that was negative for any obvious signs of corneal abrasion. He was given topical moxifloxacin which he tells me is making his eye quite itchy, so we will discontinue this and switch to topical polymyxin/trimethoprim. He will continue patching for now, particular when sleeping.    ____________________________________________ Gwen Her. Dianah Field, M.D., ABFM., CAQSM., AME. Primary Care and Sports Medicine [facility]  Adjunct Professor of [place] [employer] [employer] of Medicine  Risk manager

## 2022-05-07 ENCOUNTER — Encounter: Payer: Self-pay | Admitting: Sports Medicine

## 2022-06-21 DIAGNOSIS — R112 Nausea with vomiting, unspecified: Secondary | ICD-10-CM | POA: Diagnosis not present

## 2022-06-21 DIAGNOSIS — R509 Fever, unspecified: Secondary | ICD-10-CM | POA: Diagnosis not present

## 2022-06-21 DIAGNOSIS — R051 Acute cough: Secondary | ICD-10-CM | POA: Diagnosis not present

## 2022-06-21 DIAGNOSIS — Z20822 Contact with and (suspected) exposure to covid-19: Secondary | ICD-10-CM | POA: Diagnosis not present

## 2022-06-27 DIAGNOSIS — J019 Acute sinusitis, unspecified: Secondary | ICD-10-CM | POA: Diagnosis not present

## 2022-06-27 DIAGNOSIS — B9689 Other specified bacterial agents as the cause of diseases classified elsewhere: Secondary | ICD-10-CM | POA: Diagnosis not present

## 2022-07-15 ENCOUNTER — Ambulatory Visit: Payer: BC Managed Care – PPO | Admitting: Sports Medicine

## 2022-07-16 ENCOUNTER — Other Ambulatory Visit: Payer: Self-pay | Admitting: Sports Medicine

## 2022-07-16 DIAGNOSIS — L719 Rosacea, unspecified: Secondary | ICD-10-CM

## 2022-08-06 ENCOUNTER — Ambulatory Visit: Payer: BC Managed Care – PPO | Admitting: Sports Medicine

## 2022-08-06 ENCOUNTER — Ambulatory Visit (INDEPENDENT_AMBULATORY_CARE_PROVIDER_SITE_OTHER): Payer: BC Managed Care – PPO

## 2022-08-06 ENCOUNTER — Encounter (INDEPENDENT_AMBULATORY_CARE_PROVIDER_SITE_OTHER): Payer: BC Managed Care – PPO | Admitting: Sports Medicine

## 2022-08-06 DIAGNOSIS — M25551 Pain in right hip: Secondary | ICD-10-CM

## 2022-08-06 DIAGNOSIS — M542 Cervicalgia: Secondary | ICD-10-CM | POA: Diagnosis not present

## 2022-08-06 DIAGNOSIS — M62838 Other muscle spasm: Secondary | ICD-10-CM

## 2022-08-06 DIAGNOSIS — L719 Rosacea, unspecified: Secondary | ICD-10-CM

## 2022-08-06 DIAGNOSIS — J4520 Mild intermittent asthma, uncomplicated: Secondary | ICD-10-CM | POA: Diagnosis not present

## 2022-08-06 DIAGNOSIS — M5030 Other cervical disc degeneration, unspecified cervical region: Secondary | ICD-10-CM | POA: Insufficient documentation

## 2022-08-06 MED ORDER — METRONIDAZOLE 0.75 % EX GEL: 1.0000 | Freq: Two times a day (BID) | CUTANEOUS | 3 refills | Status: AC

## 2022-08-06 MED ORDER — IBUPROFEN 200 MG PO CAPS: 800.0000 mg | ORAL_CAPSULE | Freq: Three times a day (TID) | ORAL | Status: AC

## 2022-08-06 MED ORDER — FLUTICASONE PROPIONATE HFA 44 MCG/ACT IN AERO: 1.0000 | INHALATION_SPRAY | Freq: Two times a day (BID) | RESPIRATORY_TRACT | 12 refills | Status: AC

## 2022-08-06 NOTE — Assessment & Plan Note (Signed)
Refilling topical metronidazole.

## 2022-08-06 NOTE — Assessment & Plan Note (Signed)
Was trying to pull the rains on a horse and felt pain left neck, left periscapular. Better with abduction of the shoulder, suspect periscapular strain, adding cervical strain conditioning, x-rays, he will do the ibuprofen as above, return in 4 to 6 weeks for this.

## 2022-08-06 NOTE — Assessment & Plan Note (Signed)
This is a pleasant 52 year old male, he works training horses, for the past few weeks she has had increasing pain right hip located anterolaterally, occasional popping and mechanical symptoms worse when Abducting his hip when he mounts the horse. He does get mild to moderate gelling. No trauma. On exam he has a minimally positive FADIR sign. Minimal pain with resisted hip flexion with an extended knee. Suspect hip flexor strain versus osteoarthritis/labral injury/FAI. Adding x-rays, he will do over-the-counter ibuprofen 800 mg 3 times daily, hip flexor strain conditioning given. Return to see me in 4 to 6 weeks, MR arthrography if not better.

## 2022-08-06 NOTE — Progress Notes (Signed)
    Procedures performed today:    None.  Independent interpretation of notes and tests performed by another provider:   None.  Brief History, Exam, Impression, and Recommendations:    Right hip pain This is a pleasant 52 year old male, he works training horses, for the past few weeks she has had increasing pain right hip located anterolaterally, occasional popping and mechanical symptoms worse when Abducting his hip when he mounts the horse. He does get mild to moderate gelling. No trauma. On exam he has a minimally positive FADIR sign. Minimal pain with resisted hip flexion with an extended knee. Suspect hip flexor strain versus osteoarthritis/labral injury/FAI. Adding x-rays, he will do over-the-counter ibuprofen 800 mg 3 times daily, hip flexor strain conditioning given. Return to see me in 4 to 6 weeks, MR arthrography if not better.   Rosacea Refilling topical metronidazole.  Reactive airway disease Shamir has reactive airway disease, likely asthma. He does well with inhaled fluticasone, refilling this, I filled out his FMLA paperwork for intermittently today.  Cervical paraspinous muscle spasm Was trying to pull the rains on a horse and felt pain left neck, left periscapular. Better with abduction of the shoulder, suspect periscapular strain, adding cervical strain conditioning, x-rays, he will do the ibuprofen as above, return in 4 to 6 weeks for this.    ____________________________________________ Ihor Austin. Benjamin Stain, M.D., ABFM., CAQSM., AME. Primary Care and Sports Medicine [facility]  Adjunct Professor of Family Medicine  [employer]  Restaurant manager, fast food

## 2022-08-06 NOTE — Assessment & Plan Note (Signed)
Fate has reactive airway disease, likely asthma. He does well with inhaled fluticasone, refilling this, I filled out his FMLA paperwork for intermittently today.

## 2022-08-14 NOTE — Telephone Encounter (Signed)
I spent 5 total minutes of online digital evaluation and management services today in this patient-initiated request for online care.  This online evaluation and management is greater than 1 week after his appointment.

## 2022-08-15 ENCOUNTER — Telehealth: Payer: BC Managed Care – PPO | Admitting: Sports Medicine

## 2022-08-20 ENCOUNTER — Telehealth (INDEPENDENT_AMBULATORY_CARE_PROVIDER_SITE_OTHER): Payer: BC Managed Care – PPO | Admitting: Sports Medicine

## 2022-08-20 ENCOUNTER — Encounter: Payer: Self-pay | Admitting: Sports Medicine

## 2022-08-20 ENCOUNTER — Other Ambulatory Visit: Payer: Self-pay | Admitting: Sports Medicine

## 2022-08-20 DIAGNOSIS — M5030 Other cervical disc degeneration, unspecified cervical region: Secondary | ICD-10-CM

## 2022-08-20 NOTE — Progress Notes (Signed)
   Virtual Visit via WebEx/MyChart   I connected with  Samuel Fox  on 08/20/22 via WebEx/MyChart/Doximity Video and verified that I am speaking with the correct person using two identifiers.   I discussed the limitations, risks, security and privacy concerns of performing an evaluation and management service by WebEx/MyChart/Doximity Video, including the higher likelihood of inaccurate diagnosis and treatment, and the availability of in person appointments.  We also discussed the likely need of an additional face to face encounter for complete and high quality delivery of care.  I also discussed with the patient that there may be a patient responsible charge related to this service. The patient expressed understanding and wishes to proceed.  Provider location is in medical facility. Patient location is at their home, different from provider location. People involved in care of the patient during this telehealth encounter were myself, my nurse/medical assistant, and my front office/scheduling team member.  Review of Systems: No fevers, chills, night sweats, weight loss, chest pain, or shortness of breath.   Objective Findings:    General: Speaking full sentences, no audible heavy breathing.  Sounds alert and appropriately interactive.  Appears well.  Face symmetric.  Extraocular movements intact.  Pupils equal and round.  No nasal flaring or accessory muscle use visualized.  Independent interpretation of tests performed by another provider:   None.  Brief History, Exam, Impression, and Recommendations:    Degenerative disc disease, cervical Was trying to pull the rains on a horse and felt pain left neck, left periscapular. Better with abduction of the shoulder, suspect periscapular strain, adding cervical strain conditioning, x-rays, he will do the ibuprofen as above, return in 4 to 6 weeks for this.  Update 08/20/2022: We filled out short-term disability paperwork today with a 80-month  return to work date February 05, 2023.   I discussed the above assessment and treatment plan with the patient. The patient was provided an opportunity to ask questions and all were answered. The patient agreed with the plan and demonstrated an understanding of the instructions.   The patient was advised to call back or seek an in-person evaluation if the symptoms worsen or if the condition fails to improve as anticipated.   I provided 30 minutes of face to face and non-face-to-face time during this encounter date, time was needed to gather information, review chart, records, communicate/coordinate with staff remotely, as well as complete documentation.   ____________________________________________ Ihor Austin. Benjamin Stain, M.D., ABFM., CAQSM., AME. Primary Care and Sports Medicine [facility]  Adjunct Professor of Family Medicine  [place] [employer] [facility] [employer] [facility] [employer]  Restaurant manager, fast food

## 2022-08-20 NOTE — Telephone Encounter (Signed)
We just did that appointment today.

## 2022-08-20 NOTE — Assessment & Plan Note (Signed)
Was trying to pull the rains on a horse and felt pain left neck, left periscapular. Better with abduction of the shoulder, suspect periscapular strain, adding cervical strain conditioning, x-rays, he will do the ibuprofen as above, return in 4 to 6 weeks for this.  Update 08/20/2022: We filled out short-term disability paperwork today with a 59-month return to work date February 05, 2023.

## 2022-08-21 ENCOUNTER — Encounter: Payer: Self-pay | Admitting: Sports Medicine

## 2022-08-21 DIAGNOSIS — M5030 Other cervical disc degeneration, unspecified cervical region: Secondary | ICD-10-CM

## 2022-08-22 MED ORDER — METHOCARBAMOL 500 MG PO TABS
500.0000 mg | ORAL_TABLET | Freq: Three times a day (TID) | ORAL | 0 refills | Status: DC
Start: 2022-08-22 — End: 2022-12-27

## 2022-09-10 ENCOUNTER — Ambulatory Visit: Payer: BC Managed Care – PPO | Admitting: Sports Medicine

## 2022-09-16 ENCOUNTER — Ambulatory Visit (INDEPENDENT_AMBULATORY_CARE_PROVIDER_SITE_OTHER): Payer: BC Managed Care – PPO | Admitting: Sports Medicine

## 2022-09-16 DIAGNOSIS — M5030 Other cervical disc degeneration, unspecified cervical region: Secondary | ICD-10-CM

## 2022-09-16 NOTE — Progress Notes (Addendum)
    Procedures performed today:    None.  Independent interpretation of notes and tests performed by another provider:   None.  Brief History, Exam, Impression, and Recommendations:    Degenerative disc disease, cervical Samuel Fox returns, he has known cervical DDD, he has had symptoms now for over 6 weeks in spite of conservative treatment including medications, activity modification and home physical therapy, muscle relaxers. X-rays do show multilevel cervical DDD, he continues to have a left-sided neck pain with radiation down the left arm to the dorsum of the left hand. Due to failure of conservative treatment we will proceed now with MRI of the cervical spine, he will break his Robaxin in half as he is getting some excessive sedation, he can return to see me in a virtual visit to discuss MRI results.  Update: MRI shows multiple degenerative changes but the dominant finding of the C6-C7 disc protrusion impinging on the left C7 nerve root, proceeding with left C6-C7 interlaminar epidural.  Patient should follow-up with me 4 to 6 weeks after his injection.    ____________________________________________ Ihor Austin. Benjamin Stain, M.D., ABFM., CAQSM., AME. Primary Care and Sports Medicine [facility]  Adjunct Professor of Family Medicine  [place] [employer] [facility] [employer]  Restaurant manager, fast food

## 2022-09-16 NOTE — Assessment & Plan Note (Addendum)
Samuel Fox returns, he has known cervical DDD, he has had symptoms now for over 6 weeks in spite of conservative treatment including medications, activity modification and home physical therapy, muscle relaxers. X-rays do show multilevel cervical DDD, he continues to have a left-sided neck pain with radiation down the left arm to the dorsum of the left hand. Due to failure of conservative treatment we will proceed now with MRI of the cervical spine, he will break his Robaxin in half as he is getting some excessive sedation, he can return to see me in a virtual visit to discuss MRI results.  Update: MRI shows multiple degenerative changes but the dominant finding of the C6-C7 disc protrusion impinging on the left C7 nerve root, proceeding with left C6-C7 interlaminar epidural.  Patient should follow-up with me 4 to 6 weeks after his injection.

## 2022-09-17 ENCOUNTER — Encounter: Payer: Self-pay | Admitting: Sports Medicine

## 2022-09-23 ENCOUNTER — Encounter (INDEPENDENT_AMBULATORY_CARE_PROVIDER_SITE_OTHER): Payer: BC Managed Care – PPO | Admitting: Sports Medicine

## 2022-09-23 DIAGNOSIS — M25551 Pain in right hip: Secondary | ICD-10-CM

## 2022-09-27 NOTE — Telephone Encounter (Signed)
I spent 5 total minutes of online digital evaluation and management services in this patient-initiated request for online care. 

## 2022-09-29 ENCOUNTER — Ambulatory Visit (INDEPENDENT_AMBULATORY_CARE_PROVIDER_SITE_OTHER): Payer: BC Managed Care – PPO

## 2022-09-29 DIAGNOSIS — M5030 Other cervical disc degeneration, unspecified cervical region: Secondary | ICD-10-CM | POA: Diagnosis not present

## 2022-09-29 DIAGNOSIS — M4802 Spinal stenosis, cervical region: Secondary | ICD-10-CM | POA: Diagnosis not present

## 2022-10-08 NOTE — Addendum Note (Signed)
Addended by: Monica Becton on: 10/08/2022 06:45 AM   Modules accepted: Orders

## 2022-10-11 ENCOUNTER — Encounter: Payer: Self-pay | Admitting: Sports Medicine

## 2022-10-11 ENCOUNTER — Telehealth (INDEPENDENT_AMBULATORY_CARE_PROVIDER_SITE_OTHER): Payer: BC Managed Care – PPO | Admitting: Sports Medicine

## 2022-10-11 DIAGNOSIS — M5030 Other cervical disc degeneration, unspecified cervical region: Secondary | ICD-10-CM

## 2022-10-11 NOTE — Progress Notes (Signed)
   Virtual Visit via WebEx/MyChart   I connected with  Samuel Fox  on 10/11/22 via WebEx/MyChart/Doximity Video and verified that I am speaking with the correct person using two identifiers.   I discussed the limitations, risks, security and privacy concerns of performing an evaluation and management service by WebEx/MyChart/Doximity Video, including the higher likelihood of inaccurate diagnosis and treatment, and the availability of in person appointments.  We also discussed the likely need of an additional face to face encounter for complete and high quality delivery of care.  I also discussed with the patient that there may be a patient responsible charge related to this service. The patient expressed understanding and wishes to proceed.  Provider location is in medical facility. Patient location is at their home, different from provider location. People involved in care of the patient during this telehealth encounter were myself, my nurse/medical assistant, and my front office/scheduling team member.  Review of Systems: No fevers, chills, night sweats, weight loss, chest pain, or shortness of breath.   Objective Findings:    General: Speaking full sentences, no audible heavy breathing.  Sounds alert and appropriately interactive.  Appears well.  Face symmetric.  Extraocular movements intact.  Pupils equal and round.  No nasal flaring or accessory muscle use visualized.  Independent interpretation of tests performed by another provider:   None.  Brief History, Exam, Impression, and Recommendations:    Degenerative disc disease, cervical Samuel Fox returns in a video visit to follow-up his MRI results, he has known cervical DDD with approximately 2 months of symptoms, he had conservative treatment including activity modification, medication, physical therapy, muscle relaxers, x-rays showed multilevel cervical DDD, he had radicular symptoms to the left arm to the dorsum of the left hand  consistent with a C7 distribution, ultimately MRI showed multiple degenerative changes, dominant findings were C5-C7 degenerative disc disease with a very large C6-C7 disc protrusion impinging left C7 nerve root. We have ordered a left C6-C7 interlaminar epidural, he has not yet been contacted by [facility] imaging, we will facilitate his appointment, and I would like to see him back in about 6 weeks after the shot.   I discussed the above assessment and treatment plan with the patient. The patient was provided an opportunity to ask questions and all were answered. The patient agreed with the plan and demonstrated an understanding of the instructions.   The patient was advised to call back or seek an in-person evaluation if the symptoms worsen or if the condition fails to improve as anticipated.   I provided 30 minutes of face to face and non-face-to-face time during this encounter date, time was needed to gather information, review chart, records, communicate/coordinate with staff remotely, as well as complete documentation.   ____________________________________________ Ihor Austin. Benjamin Stain, M.D., ABFM., CAQSM., AME. Primary Care and Sports Medicine [facility]  Adjunct Professor of Family Medicine  [employer] [facility] [employer]  Restaurant manager, fast food

## 2022-10-11 NOTE — Assessment & Plan Note (Signed)
Samuel Fox returns in a video visit to follow-up his MRI results, he has known cervical DDD with approximately 2 months of symptoms, he had conservative treatment including activity modification, medication, physical therapy, muscle relaxers, x-rays showed multilevel cervical DDD, he had radicular symptoms to the left arm to the dorsum of the left hand consistent with a C7 distribution, ultimately MRI showed multiple degenerative changes, dominant findings were C5-C7 degenerative disc disease with a very large C6-C7 disc protrusion impinging left C7 nerve root. We have ordered a left C6-C7 interlaminar epidural, he has not yet been contacted by [facility] imaging, we will facilitate his appointment, and I would like to see him back in about 6 weeks after the shot.

## 2022-10-21 ENCOUNTER — Inpatient Hospital Stay
Admission: RE | Admit: 2022-10-21 | Discharge: 2022-10-21 | Disposition: A | Discharge status: Home / Self Care | Payer: BC Managed Care – PPO | Source: Ambulatory Visit | Attending: Sports Medicine | Admitting: Sports Medicine

## 2022-10-21 NOTE — Discharge Instructions (Signed)

## 2022-10-28 ENCOUNTER — Ambulatory Visit: Admission: RE | Admit: 2022-10-28 | Payer: BC Managed Care – PPO | Source: Ambulatory Visit

## 2022-10-28 DIAGNOSIS — M5030 Other cervical disc degeneration, unspecified cervical region: Secondary | ICD-10-CM

## 2022-10-28 DIAGNOSIS — M50123 Cervical disc disorder at C6-C7 level with radiculopathy: Secondary | ICD-10-CM | POA: Diagnosis not present

## 2022-10-28 MED ORDER — TRIAMCINOLONE ACETONIDE 40 MG/ML IJ SUSP (RADIOLOGY)
60.0000 mg | Freq: Once | INTRAMUSCULAR | Status: AC
  Administered 2022-10-28: 60 mg via EPIDURAL

## 2022-10-28 MED ORDER — IOPAMIDOL (ISOVUE-M 300) INJECTION 61%
1.0000 mL | Freq: Once | INTRAMUSCULAR | Status: AC | PRN
  Administered 2022-10-28: 1 mL via EPIDURAL

## 2022-10-28 NOTE — Discharge Instructions (Signed)

## 2022-10-29 ENCOUNTER — Other Ambulatory Visit: Payer: Self-pay | Admitting: Sports Medicine

## 2022-10-29 DIAGNOSIS — J4520 Mild intermittent asthma, uncomplicated: Secondary | ICD-10-CM

## 2022-12-05 ENCOUNTER — Encounter: Payer: Self-pay | Admitting: Sports Medicine

## 2022-12-05 ENCOUNTER — Ambulatory Visit: Payer: BC Managed Care – PPO | Admitting: Sports Medicine

## 2022-12-05 DIAGNOSIS — M5030 Other cervical disc degeneration, unspecified cervical region: Secondary | ICD-10-CM | POA: Diagnosis not present

## 2022-12-05 NOTE — Progress Notes (Signed)
    Procedures performed today:    None.  Independent interpretation of notes and tests performed by another provider:   None.  Brief History, Exam, Impression, and Recommendations:    Degenerative disc disease, cervical Samuel Fox returns, he is a very pleasant 52 year old male, he has known cervical DDD, ultimately we obtained an MRI after failure of conservative treatment, medications, the MRI did show a fairly large C6-C7 disc protrusion impinging the left C7 nerve, this corresponded to his symptomatology. We ordered a left C6-C7 interlaminar epidural, he had the epidural and reports near complete pain relief. He is happy with how things are going now, he can call me back if he needs another epidural. Adding a return to work note to go back on the 26th.    ____________________________________________ Ihor Austin. Benjamin Stain, M.D., ABFM., CAQSM., AME. Primary Care and Sports Medicine [facility]  Adjunct Professor of Family Medicine  [employer] [facility] [employer]  Restaurant manager, fast food

## 2022-12-05 NOTE — Assessment & Plan Note (Signed)
Samuel Fox returns, he is a very pleasant 52 year old male, he has known cervical DDD, ultimately we obtained an MRI after failure of conservative treatment, medications, the MRI did show a fairly large C6-C7 disc protrusion impinging the left C7 nerve, this corresponded to his symptomatology. We ordered a left C6-C7 interlaminar epidural, he had the epidural and reports near complete pain relief. He is happy with how things are going now, he can call me back if he needs another epidural. Adding a return to work note to go back on the 26th.

## 2022-12-26 ENCOUNTER — Encounter: Payer: Self-pay | Admitting: Sports Medicine

## 2022-12-27 ENCOUNTER — Encounter: Payer: Self-pay | Admitting: Sports Medicine

## 2022-12-27 ENCOUNTER — Ambulatory Visit: Payer: BC Managed Care – PPO | Admitting: Sports Medicine

## 2022-12-27 DIAGNOSIS — M5030 Other cervical disc degeneration, unspecified cervical region: Secondary | ICD-10-CM | POA: Diagnosis not present

## 2022-12-27 MED ORDER — PREDNISONE 50 MG PO TABS
ORAL_TABLET | ORAL | 0 refills | Status: DC
Start: 2022-12-27 — End: 2023-02-24

## 2022-12-27 MED ORDER — METHOCARBAMOL 500 MG PO TABS
500.0000 mg | ORAL_TABLET | Freq: Three times a day (TID) | ORAL | 0 refills | Status: DC
Start: 2022-12-27 — End: 2023-03-27

## 2022-12-27 NOTE — Progress Notes (Signed)
    Procedures performed today:    None.  Independent interpretation of notes and tests performed by another provider:   None.  Brief History, Exam, Impression, and Recommendations:    Degenerative disc disease, cervical Eyuel returns, he is a very pleasant 52 year old male with known cervical DDD, he has a large C6-C7 disc protrusion impinging the left C7 nerve, his symptomatology corresponds to a left C7 radiculopathy. He did have a left C6-C7 interlaminar epidural and reported near complete pain relief about a month or 2 ago. He is having recurrence of pain, not hurting enough to do another shot, we will do a short course of prednisone and Robaxin which has worked well for him in the past, return to see me as needed. Of note he has no interest in considering surgical intervention.    ____________________________________________ Samuel Fox. Samuel Fox, M.D., ABFM., CAQSM., AME. Primary Care and Sports Medicine [facility]  Adjunct Professor of Family Medicine  [employer] [facility] [employer]  Restaurant manager, fast food

## 2022-12-27 NOTE — Assessment & Plan Note (Signed)
Samuel Fox returns, he is a very pleasant 52 year old male with known cervical DDD, he has a large C6-C7 disc protrusion impinging the left C7 nerve, his symptomatology corresponds to a left C7 radiculopathy. He did have a left C6-C7 interlaminar epidural and reported near complete pain relief about a month or 2 ago. He is having recurrence of pain, not hurting enough to do another shot, we will do a short course of prednisone and Robaxin which has worked well for him in the past, return to see me as needed. Of note he has no interest in considering surgical intervention.

## 2022-12-31 ENCOUNTER — Encounter: Payer: Self-pay | Admitting: Sports Medicine

## 2022-12-31 ENCOUNTER — Ambulatory Visit (INDEPENDENT_AMBULATORY_CARE_PROVIDER_SITE_OTHER): Payer: BC Managed Care – PPO | Admitting: Sports Medicine

## 2022-12-31 VITALS — BP 119/85 | HR 87 | Ht 72.0 in | Wt 200.0 lb

## 2022-12-31 DIAGNOSIS — R739 Hyperglycemia, unspecified: Secondary | ICD-10-CM | POA: Diagnosis not present

## 2022-12-31 DIAGNOSIS — Z0000 Encounter for general adult medical examination without abnormal findings: Secondary | ICD-10-CM

## 2022-12-31 NOTE — Progress Notes (Signed)
Subjective:    CC: Annual Physical Exam  HPI:  This patient is here for their annual physical  I reviewed the past medical history, family history, social history, surgical history, and allergies today and no changes were needed.  Please see the problem list section below in epic for further details.  Past Medical History: Past Medical History:  Diagnosis Date   Asthma    Ventricular bigeminy    Past Surgical History: No past surgical history on file. Social History: Social History   Socioeconomic History   Marital status: Single    Spouse name: Not on file   Number of children: Not on file   Years of education: Not on file   Highest education level: Master's degree (e.g., MA, MS, MEng, MEd, MSW, MBA)  Occupational History   Not on file  Tobacco Use   Smoking status: Former   Smokeless tobacco: Never  Substance and Sexual Activity   Alcohol use: Yes   Drug use: Never   Sexual activity: Yes  Other Topics Concern   Not on file  Social History Narrative   Not on file   Social Determinants of Health   Financial Resource Strain: Low Risk  (09/16/2022)   Overall Financial Resource Strain (CARDIA)    Difficulty of Paying Living Expenses: Not hard at all  Food Insecurity: No Food Insecurity (09/16/2022)   Hunger Vital Sign    Worried About Running Out of Food in the Last Year: Never true    Ran Out of Food in the Last Year: Never true  Transportation Needs: No Transportation Needs (09/16/2022)   PRAPARE - Administrator, Civil Service (Medical): No    Lack of Transportation (Non-Medical): No  Physical Activity: Insufficiently Active (09/16/2022)   Exercise Vital Sign    Days of Exercise per Week: 2 days    Minutes of Exercise per Session: 20 min  Stress: No Stress Concern Present (09/16/2022)   [employer] of Occupational Health - Occupational Stress Questionnaire    Feeling of Stress : Not at all  Social Connections: Moderately Isolated (09/16/2022)    Social Connection and Isolation Panel [NHANES]    Frequency of Communication with Friends and Family: More than three times a week    Frequency of Social Gatherings with Friends and Family: Once a week    Attends Religious Services: Never    Database administrator or Organizations: No    Attends Engineer, structural: Not on file    Marital Status: Living with partner   Family History: No family history on file. Allergies: No Known Allergies Medications: See med rec.  Review of Systems: No headache, visual changes, nausea, vomiting, diarrhea, constipation, dizziness, abdominal pain, skin rash, fevers, chills, night sweats, swollen lymph nodes, weight loss, chest pain, body aches, joint swelling, muscle aches, shortness of breath, mood changes, visual or auditory hallucinations.  Objective:    General: Well Developed, well nourished, and in no acute distress.  Neuro: Alert and oriented x3, extra-ocular muscles intact, sensation grossly intact. Cranial nerves II through XII are intact, motor, sensory, and coordinative functions are all intact. HEENT: Normocephalic, atraumatic, pupils equal round reactive to light, neck supple, no masses, no lymphadenopathy, thyroid nonpalpable. Oropharynx, nasopharynx, external ear canals are unremarkable. Skin: Warm and dry, no rashes noted.  Cardiac: Regular rate and rhythm, no murmurs rubs or gallops.  Respiratory: Clear to auscultation bilaterally. Not using accessory muscles, speaking in full sentences.  Abdominal: Soft, nontender, nondistended, positive bowel  sounds, no masses, no organomegaly.  Musculoskeletal: Shoulder, elbow, wrist, hip, knee, ankle stable, and with full range of motion.  Impression and Recommendations:    The patient was counselled, risk factors were discussed, anticipatory guidance given.  Annual physical exam Fasting annual physical, labs ordered today, patient will come later for flu shot so declined  today.   ____________________________________________ Ihor Austin. Benjamin Stain, M.D., ABFM., CAQSM., AME. Primary Care and Sports Medicine [facility]  Adjunct Professor of Family Medicine  [place] [employer] [facility] [employer]  Restaurant manager, fast food

## 2022-12-31 NOTE — Assessment & Plan Note (Signed)
Fasting annual physical, labs ordered today, patient will come later for flu shot so declined today.

## 2023-01-01 LAB — COMPREHENSIVE METABOLIC PANEL WITH GFR
ALT: 47 IU/L — ABNORMAL HIGH (ref 0–44)
AST: 26 IU/L (ref 0–40)
Albumin: 4.4 g/dL (ref 3.8–4.9)
Alkaline Phosphatase: 44 IU/L (ref 44–121)
BUN/Creatinine Ratio: 12 (ref 9–20)
BUN: 11 mg/dL (ref 6–24)
Bilirubin Total: 0.7 mg/dL (ref 0.0–1.2)
CO2: 21 mmol/L (ref 20–29)
Calcium: 9.5 mg/dL (ref 8.7–10.2)
Chloride: 100 mmol/L (ref 96–106)
Creatinine, Ser: 0.94 mg/dL (ref 0.76–1.27)
Globulin, Total: 2.5 g/dL (ref 1.5–4.5)
Glucose: 102 mg/dL — ABNORMAL HIGH (ref 70–99)
Potassium: 4.3 mmol/L (ref 3.5–5.2)
Sodium: 135 mmol/L (ref 134–144)
Total Protein: 6.9 g/dL (ref 6.0–8.5)
eGFR: 98 mL/min/1.73 (ref >59)

## 2023-01-01 LAB — HEMOGLOBIN A1C
Est. average glucose Bld gHb Est-mCnc: 103 mg/dL
Hgb A1c MFr Bld: 5.2 % (ref 4.8–5.6)

## 2023-01-01 LAB — CBC
Hematocrit: 45.2 % (ref 37.5–51.0)
Hemoglobin: 15.3 g/dL (ref 13.0–17.7)
MCH: 32.1 pg (ref 26.6–33.0)
MCHC: 33.8 g/dL (ref 31.5–35.7)
MCV: 95 fL (ref 79–97)
Platelets: 300 x10E3/uL (ref 150–450)
RBC: 4.76 x10E6/uL (ref 4.14–5.80)
RDW: 11.8 % (ref 11.6–15.4)
WBC: 5.7 x10E3/uL (ref 3.4–10.8)

## 2023-01-01 LAB — LIPID PANEL
Chol/HDL Ratio: 3.2 ratio (ref 0.0–5.0)
Cholesterol, Total: 199 mg/dL (ref 100–199)
HDL: 62 mg/dL (ref >39)
LDL Chol Calc (NIH): 111 mg/dL — ABNORMAL HIGH (ref 0–99)
Triglycerides: 150 mg/dL — ABNORMAL HIGH (ref 0–149)
VLDL Cholesterol Cal: 26 mg/dL (ref 5–40)

## 2023-01-01 LAB — TSH: TSH: 1.55 u[IU]/mL (ref 0.450–4.500)

## 2023-01-14 ENCOUNTER — Encounter: Payer: Self-pay | Admitting: Sports Medicine

## 2023-01-23 ENCOUNTER — Encounter (INDEPENDENT_AMBULATORY_CARE_PROVIDER_SITE_OTHER): Payer: BC Managed Care – PPO | Admitting: Sports Medicine

## 2023-01-23 DIAGNOSIS — M5030 Other cervical disc degeneration, unspecified cervical region: Secondary | ICD-10-CM | POA: Diagnosis not present

## 2023-01-23 NOTE — Telephone Encounter (Signed)

## 2023-02-06 DIAGNOSIS — B9689 Other specified bacterial agents as the cause of diseases classified elsewhere: Secondary | ICD-10-CM | POA: Diagnosis not present

## 2023-02-06 DIAGNOSIS — J019 Acute sinusitis, unspecified: Secondary | ICD-10-CM | POA: Diagnosis not present

## 2023-02-06 DIAGNOSIS — R509 Fever, unspecified: Secondary | ICD-10-CM | POA: Diagnosis not present

## 2023-02-12 DIAGNOSIS — J018 Other acute sinusitis: Secondary | ICD-10-CM | POA: Diagnosis not present

## 2023-02-12 DIAGNOSIS — B9689 Other specified bacterial agents as the cause of diseases classified elsewhere: Secondary | ICD-10-CM | POA: Diagnosis not present

## 2023-02-24 ENCOUNTER — Ambulatory Visit: Payer: BC Managed Care – PPO

## 2023-02-24 ENCOUNTER — Ambulatory Visit: Payer: BC Managed Care – PPO | Admitting: Family Medicine

## 2023-02-24 ENCOUNTER — Encounter: Payer: Self-pay | Admitting: Family Medicine

## 2023-02-24 VITALS — BP 120/90 | HR 81 | Ht 72.0 in | Wt 194.5 lb

## 2023-02-24 DIAGNOSIS — R051 Acute cough: Secondary | ICD-10-CM | POA: Diagnosis not present

## 2023-02-24 DIAGNOSIS — J019 Acute sinusitis, unspecified: Secondary | ICD-10-CM | POA: Diagnosis not present

## 2023-02-24 DIAGNOSIS — B9689 Other specified bacterial agents as the cause of diseases classified elsewhere: Secondary | ICD-10-CM | POA: Diagnosis not present

## 2023-02-24 DIAGNOSIS — R059 Cough, unspecified: Secondary | ICD-10-CM

## 2023-02-24 DIAGNOSIS — H60501 Unspecified acute noninfective otitis externa, right ear: Secondary | ICD-10-CM | POA: Insufficient documentation

## 2023-02-24 MED ORDER — LEVOFLOXACIN 500 MG PO TABS
500.0000 mg | ORAL_TABLET | Freq: Every day | ORAL | 0 refills | Status: AC
Start: 2023-02-24 — End: 2023-03-01

## 2023-02-24 MED ORDER — METHYLPREDNISOLONE 4 MG PO TBPK: ORAL_TABLET | ORAL | 0 refills | Status: DC

## 2023-02-24 MED ORDER — GUAIFENESIN-CODEINE 100-10 MG/5ML PO SOLN
5.0000 mL | Freq: Three times a day (TID) | ORAL | 0 refills | Status: DC | PRN
Start: 2023-02-24 — End: 2023-06-27

## 2023-02-24 MED ORDER — CIPROFLOXACIN-DEXAMETHASONE 0.3-0.1 % OT SUSP: 4.0000 [drp] | Freq: Two times a day (BID) | OTIC | 0 refills | Status: AC

## 2023-02-24 NOTE — Assessment & Plan Note (Addendum)
Pt failed augmentin and doxy so we will try levaquin. This is surprising given strength of antibiotic. If no better will likely need to go to ENT for chronic sinusitis. Will go ahead and give medrol dose pack to knock down some inflammation since patient notes pressure behind his eyes. - recommended further immune support such as increasing vit c, d, and zinc  - due to chronicity of symptoms have gone ahead and ordered cxr to eval for pneumonia since mycoplasma is going around the community

## 2023-02-24 NOTE — Patient Instructions (Signed)
Increase vit C, D, and zinc    The cough medicine has some mucinex properties in it so don't take it close to taking mucinex

## 2023-02-24 NOTE — Assessment & Plan Note (Signed)
Pt notes cough - sent in robitussin with codeine, pmp reviewed and verified with no red flags - counseled not to take this while using mucinex

## 2023-02-24 NOTE — Assessment & Plan Note (Signed)
Erythema of external canal and pain with palpation of pinna. Have sent in ciprodex

## 2023-02-24 NOTE — Progress Notes (Signed)
Acute Office Visit  Subjective:     Patient ID: Samuel Fox, male    DOB: 01/14/1971, 52 y.o.   MRN: [id number]  Chief Complaint  Patient presents with   Sinus Problem    Pt states x3wks has been on 2 rounds of antibiotics last dose was sat. night    HPI Patient is in today for concerns of sinus infection. He has had symptoms for 3 weeks now. He was initially placed on Augmentin and then doxy without improvement. He also admits to a cough and some wheezing. He has a hx of asthma and has been using his inhaler. Also admits to ear pain.  Review of Systems  Constitutional:  Negative for chills and fever.  HENT:  Positive for congestion, ear pain and sinus pain.   Respiratory:  Positive for cough. Negative for shortness of breath.   Cardiovascular:  Negative for chest pain.  Neurological:  Negative for headaches.        Objective:    BP (!) 120/90 (BP Location: Left Arm, Patient Position: Sitting, Cuff Size: Large)   Pulse 81   Ht 6' (1.829 m)   Wt 194 lb 8 oz (88.2 kg)   SpO2 100%   BMI 26.38 kg/m    Physical Exam Vitals and nursing note reviewed.  Constitutional:      General: He is not in acute distress.    Appearance: Normal appearance.  HENT:     Head: Normocephalic and atraumatic.     Left Ear: Tympanic membrane, ear canal and external ear normal.     Ears:     Comments: Erythema of external ear canal. Pain with palpation of pinna of R ear    Nose: Congestion present.     Mouth/Throat:     Pharynx: Posterior oropharyngeal erythema present.  Eyes:     Conjunctiva/sclera: Conjunctivae normal.  Cardiovascular:     Rate and Rhythm: Normal rate and regular rhythm.  Pulmonary:     Effort: Pulmonary effort is normal.     Breath sounds: Normal breath sounds.  Neurological:     General: No focal deficit present.     Mental Status: He is alert and oriented to person, place, and time.  Psychiatric:        Mood and Affect: Mood normal.        Behavior: Behavior  normal.        Thought Content: Thought content normal.        Judgment: Judgment normal.     No results found for any visits on 02/24/23.      Assessment & Plan:   Problem List Items Addressed This Visit       Respiratory   Acute bacterial sinusitis - Primary    Pt failed augmentin and doxy so we will try levaquin. This is surprising given strength of antibiotic. If no better will likely need to go to ENT for chronic sinusitis. Will go ahead and give medrol dose pack to knock down some inflammation since patient notes pressure behind his eyes. - recommended further immune support such as increasing vit c, d, and zinc  - due to chronicity of symptoms have gone ahead and ordered cxr to eval for pneumonia since mycoplasma is going around the community      Relevant Medications   levofloxacin (LEVAQUIN) 500 MG tablet   guaiFENesin-codeine 100-10 MG/5ML syrup   methylPREDNISolone (MEDROL DOSEPAK) 4 MG TBPK tablet     Nervous and Auditory   Acute otitis  externa of right ear    Erythema of external canal and pain with palpation of pinna. Have sent in ciprodex         Other   Acute cough    Pt notes cough - sent in robitussin with codeine, pmp reviewed and verified with no red flags - counseled not to take this while using mucinex      Relevant Medications   guaiFENesin-codeine 100-10 MG/5ML syrup   Other Relevant Orders   DG Chest 2 View    Meds ordered this encounter  Medications   levofloxacin (LEVAQUIN) 500 MG tablet    Sig: Take 1 tablet (500 mg total) by mouth daily for 5 days.    Dispense:  5 tablet    Refill:  0   guaiFENesin-codeine 100-10 MG/5ML syrup    Sig: Take 5 mLs by mouth 3 (three) times daily as needed for cough.    Dispense:  240 mL    Refill:  0   methylPREDNISolone (MEDROL DOSEPAK) 4 MG TBPK tablet    Sig: Follow instructions on pill pack    Dispense:  21 tablet    Refill:  0   ciprofloxacin-dexamethasone (CIPRODEX) OTIC suspension    Sig:  Place 4 drops into the right ear 2 (two) times daily for 7 days.    Dispense:  7.5 mL    Refill:  0    No follow-ups on file.  Charlton Amor, DO

## 2023-02-25 ENCOUNTER — Encounter: Payer: Self-pay | Admitting: Family Medicine

## 2023-02-26 ENCOUNTER — Encounter: Payer: Self-pay | Admitting: Family Medicine

## 2023-03-11 ENCOUNTER — Other Ambulatory Visit: Payer: Self-pay | Admitting: Sports Medicine

## 2023-03-11 DIAGNOSIS — M5030 Other cervical disc degeneration, unspecified cervical region: Secondary | ICD-10-CM

## 2023-03-14 ENCOUNTER — Other Ambulatory Visit: Payer: Self-pay | Admitting: Medical-Surgical

## 2023-03-27 ENCOUNTER — Other Ambulatory Visit: Payer: Self-pay | Admitting: Sports Medicine

## 2023-03-27 DIAGNOSIS — M5030 Other cervical disc degeneration, unspecified cervical region: Secondary | ICD-10-CM

## 2023-03-28 MED ORDER — METHOCARBAMOL 500 MG PO TABS: 500.0000 mg | ORAL_TABLET | Freq: Three times a day (TID) | ORAL | 0 refills | Status: DC

## 2023-04-23 DIAGNOSIS — S39012A Strain of muscle, fascia and tendon of lower back, initial encounter: Secondary | ICD-10-CM | POA: Diagnosis not present

## 2023-04-23 DIAGNOSIS — R197 Diarrhea, unspecified: Secondary | ICD-10-CM | POA: Diagnosis not present

## 2023-04-25 ENCOUNTER — Ambulatory Visit (INDEPENDENT_AMBULATORY_CARE_PROVIDER_SITE_OTHER): Payer: BC Managed Care – PPO

## 2023-04-25 ENCOUNTER — Encounter: Payer: Self-pay | Admitting: Sports Medicine

## 2023-04-25 ENCOUNTER — Ambulatory Visit: Payer: BC Managed Care – PPO | Admitting: Sports Medicine

## 2023-04-25 DIAGNOSIS — M47816 Spondylosis without myelopathy or radiculopathy, lumbar region: Secondary | ICD-10-CM | POA: Diagnosis not present

## 2023-04-25 DIAGNOSIS — M545 Low back pain, unspecified: Secondary | ICD-10-CM | POA: Diagnosis not present

## 2023-04-25 MED ORDER — PREDNISONE 50 MG PO TABS: ORAL_TABLET | ORAL | 0 refills | Status: DC

## 2023-04-25 NOTE — Assessment & Plan Note (Signed)
 Pleasant 53 year old male, known cervical DDD, he went to sit down in a recliner, it popped out and he fell awkwardly. Since then he has had pain in the midline low back, worse with sitting, flexion, Valsalva, nothing radicular, no red flag symptoms. He did get Zanaflex  for another provider, adding 5 days of prednisone , continue Zanaflex , x-rays, home PT given, we explained the anatomy and evolutionary anthropology of lumbar disc disease, I also explained the anatomy of an annular tear and prognosis, Samuel Fox has in fact noted that crawling on all fours seems to help his pain. He will avoid horseback riding for now and return to see me in 6 weeks.

## 2023-04-25 NOTE — Progress Notes (Signed)
    Procedures performed today:    None.  Independent interpretation of notes and tests performed by another provider:   None.  Brief History, Exam, Impression, and Recommendations:    Acute midline low back pain without sciatica Pleasant 53 year old male, known cervical DDD, he went to sit down in a recliner, it popped out and he fell awkwardly. Since then he has had pain in the midline low back, worse with sitting, flexion, Valsalva, nothing radicular, no red flag symptoms. He did get Zanaflex  for another provider, adding 5 days of prednisone , continue Zanaflex , x-rays, home PT given, we explained the anatomy and evolutionary anthropology of lumbar disc disease, I also explained the anatomy of an annular tear and prognosis, Mishon has in fact noted that crawling on all fours seems to help his pain. He will avoid horseback riding for now and return to see me in 6 weeks.    ____________________________________________ Debby PARAS. Curtis, M.D., ABFM., CAQSM., AME. Primary Care and Sports Medicine [facility]  Adjunct Professor of [facility] Medicine  [employer]  Restaurant Manager, [employer]

## 2023-04-28 ENCOUNTER — Encounter: Payer: Self-pay | Admitting: Sports Medicine

## 2023-05-07 ENCOUNTER — Encounter (INDEPENDENT_AMBULATORY_CARE_PROVIDER_SITE_OTHER): Payer: BC Managed Care – PPO | Admitting: Sports Medicine

## 2023-05-07 DIAGNOSIS — M545 Low back pain, unspecified: Secondary | ICD-10-CM | POA: Diagnosis not present

## 2023-05-07 NOTE — Telephone Encounter (Signed)

## 2023-05-15 ENCOUNTER — Telehealth: Payer: BC Managed Care – PPO | Admitting: Sports Medicine

## 2023-05-15 DIAGNOSIS — M545 Low back pain, unspecified: Secondary | ICD-10-CM

## 2023-05-15 NOTE — Assessment & Plan Note (Signed)
Prior history:  Pleasant 53 year old male, known cervical DDD, he went to sit down in a recliner, it popped out and he fell awkwardly. Since then he has had pain in the midline low back, worse with sitting, flexion, Valsalva, nothing radicular, no red flag symptoms. He did get Zanaflex for another provider, adding 5 days of prednisone, continue Zanaflex, x-rays, home PT given, we explained the anatomy and evolutionary anthropology of lumbar disc disease, I also explained the anatomy of an annular tear and prognosis, Samuel Fox has in fact noted that crawling on all fours seems to help his pain. He will avoid horseback riding for now and return to see me in 6 weeks.  Update today: We spent some time filling out disability paperwork through the virtual visit.  This will be faxed, copied, scanned and the original will be left at the front for [facility] to pick up.

## 2023-05-15 NOTE — Progress Notes (Signed)
   Virtual Visit via WebEx/MyChart   I connected with  Samuel Fox  on 05/15/23 via WebEx/MyChart/Doximity Video and verified that I am speaking with the correct person using two identifiers.   I discussed the limitations, risks, security and privacy concerns of performing an evaluation and management service by WebEx/MyChart/Doximity Video, including the higher likelihood of inaccurate diagnosis and treatment, and the availability of in person appointments.  We also discussed the likely need of an additional face to face encounter for complete and high quality delivery of care.  I also discussed with the patient that there may be a patient responsible charge related to this service. The patient expressed understanding and wishes to proceed.  Provider location is in medical facility. Patient location is at their home, different from provider location. People involved in care of the patient during this telehealth encounter were myself, my nurse/medical assistant, and my front office/scheduling team member.  Review of Systems: No fevers, chills, night sweats, weight loss, chest pain, or shortness of breath.   Objective Findings:    General: Speaking full sentences, no audible heavy breathing.  Sounds alert and appropriately interactive.  Appears well.  Face symmetric.  Extraocular movements intact.  Pupils equal and round.  No nasal flaring or accessory muscle use visualized.  Independent interpretation of tests performed by another provider:   None.  Brief History, Exam, Impression, and Recommendations:    Acute midline low back pain without sciatica Prior history:  Samuel Fox 53 year old male, known cervical DDD, he went to sit down in a recliner, it popped out and he fell awkwardly. Since then he has had pain in the midline low back, worse with sitting, flexion, Valsalva, nothing radicular, no red flag symptoms. He did get Zanaflex for another provider, adding 5 days of prednisone,  continue Zanaflex, x-rays, home PT given, we explained the anatomy and evolutionary anthropology of lumbar disc disease, I also explained the anatomy of an annular tear and prognosis, Samuel Fox has in fact noted that crawling on all fours seems to help his pain. He will avoid horseback riding for now and return to see me in 6 weeks.  Update today: We spent some time filling out disability paperwork through the virtual visit.  This will be faxed, copied, scanned and the original will be left at the front for [facility] to pick up.   I discussed the above assessment and treatment plan with the patient. The patient was provided an opportunity to ask questions and all were answered. The patient agreed with the plan and demonstrated an understanding of the instructions.   The patient was advised to call back or seek an in-person evaluation if the symptoms worsen or if the condition fails to improve as anticipated.   I provided 15 minutes of face to face and non-face-to-face time during this encounter date, time was needed to gather information, review chart, records, communicate/coordinate with staff remotely, as well as complete documentation.   ____________________________________________ Ihor Austin. Benjamin Stain, M.D., ABFM., CAQSM., AME. Primary Care and Sports Medicine [facility]  Adjunct Professor of Family Medicine  [place] [employer] [facility] [employer]  Restaurant manager, fast food

## 2023-06-06 ENCOUNTER — Ambulatory Visit: Payer: BC Managed Care – PPO | Admitting: Sports Medicine

## 2023-06-06 DIAGNOSIS — M545 Low back pain, unspecified: Secondary | ICD-10-CM | POA: Diagnosis not present

## 2023-06-06 MED ORDER — TIZANIDINE HCL 4 MG PO TABS
4.0000 mg | ORAL_TABLET | Freq: Two times a day (BID) | ORAL | 3 refills | Status: AC | PRN
Start: 2023-06-06 — End: ?

## 2023-06-06 NOTE — Assessment & Plan Note (Signed)
This is a very pleasant 53 year old male returns, he has known cervical DDD, per previous notes he went to sit down in a recliner, it popped out and he fell awkwardly, he then developed immediate pain midline low back worse with sitting, flexion, Valsalva without radiculopathy, this is all consistent with discogenic back pain. I suspected more of an annular tear, he has improved with conservative treatment including prednisone, Zanaflex. We filled out some disability paperwork at the last visit. As he is continuing to improve I have encouraged him to continue his core herniated disc conditioning exercises which will be the most effective for long-term relief. Refilling tizanidine. Return as needed. MRI for epidural planning if not better.

## 2023-06-06 NOTE — Progress Notes (Signed)
    Procedures performed today:    None.  Independent interpretation of notes and tests performed by another provider:   None.  Brief History, Exam, Impression, and Recommendations:    Acute midline low back pain without sciatica This is a very pleasant 53 year old male returns, he has known cervical DDD, per previous notes he went to sit down in a recliner, it popped out and he fell awkwardly, he then developed immediate pain midline low back worse with sitting, flexion, Valsalva without radiculopathy, this is all consistent with discogenic back pain. I suspected more of an annular tear, he has improved with conservative treatment including prednisone, Zanaflex. We filled out some disability paperwork at the last visit. As he is continuing to improve I have encouraged him to continue his core herniated disc conditioning exercises which will be the most effective for long-term relief. Refilling tizanidine. Return as needed. MRI for epidural planning if not better.  Chronic process not at goal with pharmacologic intervention  ____________________________________________ Samuel Fox. Benjamin Stain, M.D., ABFM., CAQSM., AME. Primary Care and Sports Medicine  [facility]  Adjunct Professor of Family Medicine  [employer] [employer] [facility] [employer]  Restaurant manager, fast food

## 2023-06-25 ENCOUNTER — Encounter: Payer: Self-pay | Admitting: Sports Medicine

## 2023-06-27 ENCOUNTER — Telehealth: Admitting: Sports Medicine

## 2023-06-27 ENCOUNTER — Encounter: Payer: Self-pay | Admitting: Sports Medicine

## 2023-06-27 DIAGNOSIS — M545 Low back pain, unspecified: Secondary | ICD-10-CM | POA: Diagnosis not present

## 2023-06-27 NOTE — Assessment & Plan Note (Signed)
 This is a very pleasant 53 year old male returns, he has known cervical DDD, per previous notes he went to sit down in a recliner, it popped out and he fell awkwardly, he then developed immediate pain midline low back worse with sitting, flexion, Valsalva without radiculopathy, this is all consistent with discogenic back pain. I suspected more of an annular tear, he has improved with conservative treatment including prednisone, Zanaflex. We filled out some disability paperwork at the last visit. As he is continuing to improve I have encouraged him to continue his core herniated disc conditioning exercises which will be the most effective for long-term relief. Refilling tizanidine. Return as needed. MRI for epidural planning if not better.  Update 06/27/2023 today we did a virtual visit to fill out FMLA paperwork.  This will be scanned in and return to him for pickup, leave date 06/19/2023 07/13/2023.

## 2023-06-27 NOTE — Progress Notes (Signed)
   Virtual Visit via WebEx/MyChart   I connected with  Samuel Fox  on 06/27/23 via WebEx/MyChart/Doximity Video and verified that I am speaking with the correct person using two identifiers.   I discussed the limitations, risks, security and privacy concerns of performing an evaluation and management service by WebEx/MyChart/Doximity Video, including the higher likelihood of inaccurate diagnosis and treatment, and the availability of in person appointments.  We also discussed the likely need of an additional face to face encounter for complete and high quality delivery of care.  I also discussed with the patient that there may be a patient responsible charge related to this service. The patient expressed understanding and wishes to proceed.  Provider location is in medical facility. Patient location is at their home, different from provider location. People involved in care of the patient during this telehealth encounter were myself, my nurse/medical assistant, and my front office/scheduling team member.  Review of Systems: No fevers, chills, night sweats, weight loss, chest pain, or shortness of breath.   Objective Findings:    General: Speaking full sentences, no audible heavy breathing.  Sounds alert and appropriately interactive.  Appears well.  Face symmetric.  Extraocular movements intact.  Pupils equal and round.  No nasal flaring or accessory muscle use visualized.  Independent interpretation of tests performed by another provider:   None.  Brief History, Exam, Impression, and Recommendations:    Acute midline low back pain without sciatica This is a very pleasant 53 year old male returns, he has known cervical DDD, per previous notes he went to sit down in a recliner, it popped out and he fell awkwardly, he then developed immediate pain midline low back worse with sitting, flexion, Valsalva without radiculopathy, this is all consistent with discogenic back pain. I suspected more  of an annular tear, he has improved with conservative treatment including prednisone, Zanaflex. We filled out some disability paperwork at the last visit. As he is continuing to improve I have encouraged him to continue his core herniated disc conditioning exercises which will be the most effective for long-term relief. Refilling tizanidine. Return as needed. MRI for epidural planning if not better.  Update 06/27/2023 today we did a virtual visit to fill out FMLA paperwork.  This will be scanned in and return to him for pickup, leave date 06/19/2023 07/13/2023.   I discussed the above assessment and treatment plan with the patient. The patient was provided an opportunity to ask questions and all were answered. The patient agreed with the plan and demonstrated an understanding of the instructions.   The patient was advised to call back or seek an in-person evaluation if the symptoms worsen or if the condition fails to improve as anticipated.   I provided 30 minutes of face to face and non-face-to-face time during this encounter date, time was needed to gather information, review chart, records, communicate/coordinate with staff remotely, as well as complete documentation.   ____________________________________________ Ihor Austin. Benjamin Stain, M.D., ABFM., CAQSM., AME. Primary Care and Sports Medicine [place] [facility]  Adjunct Professor of Family Medicine  [place] [employer] [facility] [employer]  Restaurant manager, fast food

## 2023-06-27 NOTE — Addendum Note (Signed)
 Addended by: Samule Dry on: 06/27/2023 11:04 AM   Modules accepted: Orders

## 2023-07-03 ENCOUNTER — Encounter: Payer: Self-pay | Admitting: Sports Medicine

## 2023-07-07 ENCOUNTER — Telehealth: Admitting: Sports Medicine

## 2023-07-08 ENCOUNTER — Telehealth (INDEPENDENT_AMBULATORY_CARE_PROVIDER_SITE_OTHER): Admitting: Sports Medicine

## 2023-07-08 DIAGNOSIS — M545 Low back pain, unspecified: Secondary | ICD-10-CM | POA: Diagnosis not present

## 2023-07-08 NOTE — Progress Notes (Signed)
   Virtual Visit via WebEx/MyChart   I connected with  Samuel Fox  on 07/08/23 via WebEx/MyChart/Doximity Video and verified that I am speaking with the correct person using two identifiers.   I discussed the limitations, risks, security and privacy concerns of performing an evaluation and management service by WebEx/MyChart/Doximity Video, including the higher likelihood of inaccurate diagnosis and treatment, and the availability of in person appointments.  We also discussed the likely need of an additional face to face encounter for complete and high quality delivery of care.  I also discussed with the patient that there may be a patient responsible charge related to this service. The patient expressed understanding and wishes to proceed.  Provider location is in medical facility. Patient location is at their home, different from provider location. People involved in care of the patient during this telehealth encounter were myself, my nurse/medical assistant, and my front office/scheduling team member.  Review of Systems: No fevers, chills, night sweats, weight loss, chest pain, or shortness of breath.   Objective Findings:    General: Speaking full sentences, no audible heavy breathing.  Sounds alert and appropriately interactive.  Appears well.  Face symmetric.  Extraocular movements intact.  Pupils equal and round.  No nasal flaring or accessory muscle use visualized.  Independent interpretation of tests performed by another provider:   None.  Brief History, Exam, Impression, and Recommendations:    Acute midline low back pain without sciatica This is a very pleasant 53 year old male returns, he has known cervical DDD, per previous notes he went to sit down in a recliner, it popped out and he fell awkwardly, he then developed immediate pain midline low back worse with sitting, flexion, Valsalva without radiculopathy, this is all consistent with discogenic back pain. I suspected more  of an annular tear, he has improved with conservative treatment including prednisone, Zanaflex. We filled out some disability paperwork at the last visit. As he is continuing to improve I have encouraged him to continue his core herniated disc conditioning exercises which will be the most effective for long-term relief. Refilling tizanidine. Return as needed. MRI for epidural planning if not better.  Update 06/27/2023 today we did a virtual visit to fill out FMLA paperwork.  This will be scanned in and return to him for pickup, leave date 06/19/2023 07/13/2023.  Update:  We did another virtual visit for disability paperwork.   I discussed the above assessment and treatment plan with the patient. The patient was provided an opportunity to ask questions and all were answered. The patient agreed with the plan and demonstrated an understanding of the instructions.   The patient was advised to call back or seek an in-person evaluation if the symptoms worsen or if the condition fails to improve as anticipated.   I provided 30 minutes of face to face and non-face-to-face time during this encounter date, time was needed to gather information, review chart, records, communicate/coordinate with staff remotely, as well as complete documentation.   ____________________________________________ Ihor Austin. Benjamin Stain, M.D., ABFM., CAQSM., AME. Primary Care and Sports Medicine [place] [facility]  Adjunct Professor of Family Medicine  [place] [employer] [facility] [employer]  Restaurant manager, fast food

## 2023-07-08 NOTE — Assessment & Plan Note (Signed)
 This is a very pleasant 53 year old male returns, he has known cervical DDD, per previous notes he went to sit down in a recliner, it popped out and he fell awkwardly, he then developed immediate pain midline low back worse with sitting, flexion, Valsalva without radiculopathy, this is all consistent with discogenic back pain. I suspected more of an annular tear, he has improved with conservative treatment including prednisone, Zanaflex. We filled out some disability paperwork at the last visit. As he is continuing to improve I have encouraged him to continue his core herniated disc conditioning exercises which will be the most effective for long-term relief. Refilling tizanidine. Return as needed. MRI for epidural planning if not better.  Update 06/27/2023 today we did a virtual visit to fill out FMLA paperwork.  This will be scanned in and return to him for pickup, leave date 06/19/2023 07/13/2023.  Update:  We did another virtual visit for disability paperwork.

## 2023-07-09 ENCOUNTER — Other Ambulatory Visit: Payer: Self-pay | Admitting: Medical-Surgical

## 2023-07-09 MED ORDER — LISINOPRIL 5 MG PO TABS: 5.0000 mg | ORAL_TABLET | Freq: Every day | ORAL | 0 refills | Status: DC

## 2023-08-06 ENCOUNTER — Encounter: Payer: Self-pay | Admitting: Sports Medicine

## 2023-08-11 ENCOUNTER — Telehealth (INDEPENDENT_AMBULATORY_CARE_PROVIDER_SITE_OTHER): Admitting: Sports Medicine

## 2023-08-11 DIAGNOSIS — M545 Low back pain, unspecified: Secondary | ICD-10-CM

## 2023-08-11 NOTE — Assessment & Plan Note (Signed)
 Samuel Fox continues to have axial low back pain, he has known cervical DDD, he recalls sitting down in a recliner, it popped out and he fell awkwardly then developed immediate pain midline low back worse with sitting, flexion, and Valsalva without radiculopathy, also noted discogenic, likely an annular tear, initially he improved with prednisone  and Zanaflex  but unfortunately had some worsening of pain, he has been doing some core herniated disc conditioning exercises, he continued with tizanidine , x-rays did show multilevel lumbar DDD. At this point he has failed over 6 weeks of conservative treatment including physician directed physical therapy, steroids, muscle relaxers, x-rays are in the chart, proceeding with MRI for epidural planning. Of note today we also filled out additional [place] disability paperwork for return to work date Sep 08, 2023 (scanned, faxed, and placed at the front for patient to pick up). We can let him know the results of the MRI, but we will not order an epidural yet.

## 2023-08-11 NOTE — Progress Notes (Signed)
   Virtual Visit via Telephone   I connected with  Samuel Fox  on 08/11/23 by telephone/telehealth and verified that I am speaking with the correct person using two identifiers.   I discussed the limitations, risks, security and privacy concerns of performing an evaluation and management service by telephone, including the higher likelihood of inaccurate diagnosis and treatment, and the availability of in person appointments.  We also discussed the likely need of an additional face to face encounter for complete and high quality delivery of care.  I also discussed with the patient that there may be a patient responsible charge related to this service. The patient expressed understanding and wishes to proceed.  Provider location is in medical facility. Patient location is at their home, different from provider location. People involved in care of the patient during this telehealth encounter were myself, my nurse/medical assistant, and my front office/scheduling team member.  Review of Systems: No fevers, chills, night sweats, weight loss, chest pain, or shortness of breath.   Objective Findings:    General: Speaking full sentences, no audible heavy breathing.  Sounds alert and appropriately interactive.    Independent interpretation of tests performed by another provider:   None.  Brief History, Exam, Impression, and Recommendations:    Acute midline low back pain without sciatica Printess continues to have axial low back pain, he has known cervical DDD, he recalls sitting down in a recliner, it popped out and he fell awkwardly then developed immediate pain midline low back worse with sitting, flexion, and Valsalva without radiculopathy, also noted discogenic, likely an annular tear, initially he improved with prednisone  and Zanaflex  but unfortunately had some worsening of pain, he has been doing some core herniated disc conditioning exercises, he continued with tizanidine , x-rays did show  multilevel lumbar DDD. At this point he has failed over 6 weeks of conservative treatment including physician directed physical therapy, steroids, muscle relaxers, x-rays are in the chart, proceeding with MRI for epidural planning. Of note today we also filled out additional [place] disability paperwork for return to work date Sep 08, 2023 (scanned, faxed, and placed at the front for patient to pick up). We can let him know the results of the MRI, but we will not order an epidural yet.   I discussed the above assessment and treatment plan with the patient. The patient was provided an opportunity to ask questions and all were answered. The patient agreed with the plan and demonstrated an understanding of the instructions.   The patient was advised to call back or seek an in-person evaluation if the symptoms worsen or if the condition fails to improve as anticipated.   I provided 15 minutes of verbal and non-verbal time during this encounter date, time was needed to gather information, review chart, records, communicate/coordinate with staff remotely, as well as complete documentation.   ____________________________________________ Joselyn Nicely. Sandy Crumb, M.D., ABFM., CAQSM., AME. Primary Care and Sports Medicine [place] [facility]  Adjunct Professor of [facility] Medicine  [employer]  Restaurant manager, fast food

## 2023-08-25 ENCOUNTER — Other Ambulatory Visit

## 2023-08-26 ENCOUNTER — Other Ambulatory Visit

## 2023-08-30 ENCOUNTER — Ambulatory Visit

## 2023-08-30 DIAGNOSIS — M545 Low back pain, unspecified: Secondary | ICD-10-CM | POA: Diagnosis not present

## 2023-08-30 DIAGNOSIS — M5136 Other intervertebral disc degeneration, lumbar region with discogenic back pain only: Secondary | ICD-10-CM | POA: Diagnosis not present

## 2023-08-30 DIAGNOSIS — M47816 Spondylosis without myelopathy or radiculopathy, lumbar region: Secondary | ICD-10-CM | POA: Diagnosis not present

## 2023-08-30 DIAGNOSIS — M48061 Spinal stenosis, lumbar region without neurogenic claudication: Secondary | ICD-10-CM | POA: Diagnosis not present

## 2023-08-30 DIAGNOSIS — M4316 Spondylolisthesis, lumbar region: Secondary | ICD-10-CM | POA: Diagnosis not present

## 2023-09-02 ENCOUNTER — Ambulatory Visit: Admitting: Sports Medicine

## 2023-09-04 ENCOUNTER — Telehealth (INDEPENDENT_AMBULATORY_CARE_PROVIDER_SITE_OTHER): Admitting: Sports Medicine

## 2023-09-04 ENCOUNTER — Encounter: Payer: Self-pay | Admitting: Sports Medicine

## 2023-09-04 DIAGNOSIS — M545 Low back pain, unspecified: Secondary | ICD-10-CM | POA: Diagnosis not present

## 2023-09-04 NOTE — Assessment & Plan Note (Signed)
 Samuel Fox has axial low back pain, he has known cervical DDD, he has done some conditioning but continues to have pain. We did obtain an MRI the results of which are not officially back yet however I did provide my own interpretation above. He is interested in trying a lumbar epidural, I will place the order for this, he would prefer to switch to Dr. Vonn Guard here in [place] for convenience. We did additional [place] disability paperwork for him today. I can see him back approximately 6 weeks after the epidural.

## 2023-09-04 NOTE — Progress Notes (Signed)
   Virtual Visit via WebEx/MyChart   I connected with  Samuel Fox  on 09/04/23 via WebEx/MyChart/Doximity Video and verified that I am speaking with the correct person using two identifiers.   I discussed the limitations, risks, security and privacy concerns of performing an evaluation and management service by WebEx/MyChart/Doximity Video, including the higher likelihood of inaccurate diagnosis and treatment, and the availability of in person appointments.  We also discussed the likely need of an additional face to face encounter for complete and high quality delivery of care.  I also discussed with the patient that there may be a patient responsible charge related to this service. The patient expressed understanding and wishes to proceed.  Provider location is in medical facility. Patient location is at their home, different from provider location. People involved in care of the patient during this telehealth encounter were myself, my nurse/medical assistant, and my front office/scheduling team member.  Review of Systems: No fevers, chills, night sweats, weight loss, chest pain, or shortness of breath.   Objective Findings:    General: Speaking full sentences, no audible heavy breathing.  Sounds alert and appropriately interactive.  Appears well.  Face symmetric.  Extraocular movements intact.  Pupils equal and round.  No nasal flaring or accessory muscle use visualized.  Independent interpretation of tests performed by another provider:   Lumbar spine MRI personally reviewed, there is degenerative disc disease at the L4-L5 level, there is also mild facet arthritis on the right at L4-L5 with a small facet cyst.  Brief History, Exam, Impression, and Recommendations:    Acute midline low back pain without sciatica Samuel Fox has axial low back pain, he has known cervical DDD, he has done some conditioning but continues to have pain. We did obtain an MRI the results of which are not officially  back yet however I did provide my own interpretation above. He is interested in trying a lumbar epidural, I will place the order for this, he would prefer to switch to Dr. Vonn Fox here in [place] for convenience. We did additional [place] disability paperwork for him today. I can see him back approximately 6 weeks after the epidural.   I discussed the above assessment and treatment plan with the patient. The patient was provided an opportunity to ask questions and all were answered. The patient agreed with the plan and demonstrated an understanding of the instructions.   The patient was advised to call back or seek an in-person evaluation if the symptoms worsen or if the condition fails to improve as anticipated.   I provided 30 minutes of face to face and non-face-to-face time during this encounter date, time was needed to gather information, review chart, records, communicate/coordinate with staff remotely, as well as complete documentation.   ____________________________________________ Samuel Fox. Samuel Fox, M.D., ABFM., CAQSM., AME. Primary Care and Sports Medicine [place] [facility]  Adjunct Professor of [facility] Medicine  [employer]  Restaurant manager, fast food

## 2023-09-05 ENCOUNTER — Ambulatory Visit: Payer: Self-pay | Admitting: Sports Medicine

## 2023-09-26 DIAGNOSIS — M47816 Spondylosis without myelopathy or radiculopathy, lumbar region: Secondary | ICD-10-CM | POA: Diagnosis not present

## 2023-09-26 DIAGNOSIS — M5136 Other intervertebral disc degeneration, lumbar region with discogenic back pain only: Secondary | ICD-10-CM | POA: Diagnosis not present

## 2023-09-26 DIAGNOSIS — M48061 Spinal stenosis, lumbar region without neurogenic claudication: Secondary | ICD-10-CM | POA: Diagnosis not present

## 2023-10-13 ENCOUNTER — Ambulatory Visit: Admitting: Sports Medicine

## 2023-10-15 ENCOUNTER — Encounter: Payer: Self-pay | Admitting: Sports Medicine

## 2023-10-15 ENCOUNTER — Ambulatory Visit: Admitting: Sports Medicine

## 2023-10-15 DIAGNOSIS — M545 Low back pain, unspecified: Secondary | ICD-10-CM | POA: Diagnosis not present

## 2023-10-15 NOTE — Progress Notes (Signed)
    Procedures performed today:    None.  Independent interpretation of notes and tests performed by another provider:   None.  Brief History, Exam, Impression, and Recommendations:    Acute midline low back pain without sciatica Samuel Fox is a pleasant 53 year old male with lumbar DDD, facet arthropathy, he has seen Dr. Rosella, medial branch blocks are planned. Today we did some disability paperwork, return to work date of November 17, 2023.   Return to see me as needed.    ____________________________________________ Debby PARAS. Curtis, M.D., ABFM., CAQSM., AME. Primary Care and Sports Medicine [facility]  Adjunct Professor of [facility] Medicine  [employer]  Restaurant manager, fast food

## 2023-10-15 NOTE — Assessment & Plan Note (Signed)
 Samuel Fox is a pleasant 53 year old male with lumbar DDD, facet arthropathy, he has seen Dr. Rosella, medial branch blocks are planned. Today we did some disability paperwork, return to work date of November 17, 2023.   Return to see me as needed.

## 2023-11-06 DIAGNOSIS — M47816 Spondylosis without myelopathy or radiculopathy, lumbar region: Secondary | ICD-10-CM | POA: Diagnosis not present

## 2023-11-12 ENCOUNTER — Encounter: Payer: Self-pay | Admitting: Sports Medicine

## 2023-11-13 ENCOUNTER — Ambulatory Visit: Admitting: Sports Medicine

## 2023-11-14 ENCOUNTER — Other Ambulatory Visit: Payer: Self-pay | Admitting: Sports Medicine

## 2023-11-14 NOTE — Telephone Encounter (Signed)
 Called patient, ,LVM to call office to schedule physical, thanks.

## 2023-11-14 NOTE — Telephone Encounter (Signed)
 Called patient left a message to call back and schedule an appointment for physical in September

## 2023-11-18 ENCOUNTER — Encounter: Payer: Self-pay | Admitting: Sports Medicine

## 2023-11-18 ENCOUNTER — Telehealth (INDEPENDENT_AMBULATORY_CARE_PROVIDER_SITE_OTHER): Admitting: Sports Medicine

## 2023-11-18 DIAGNOSIS — I10 Essential (primary) hypertension: Secondary | ICD-10-CM | POA: Diagnosis not present

## 2023-11-18 MED ORDER — VALSARTAN 40 MG PO TABS
40.0000 mg | ORAL_TABLET | Freq: Every day | ORAL | 3 refills | Status: DC
Start: 2023-11-18 — End: 2024-02-18

## 2023-11-18 NOTE — Progress Notes (Signed)
   Virtual Visit via WebEx/MyChart   I connected with  Samuel Fox  on 11/18/23 via WebEx/MyChart/Doximity Video and verified that I am speaking with the correct person using two identifiers.   I discussed the limitations, risks, security and privacy concerns of performing an evaluation and management service by WebEx/MyChart/Doximity Video, including the higher likelihood of inaccurate diagnosis and treatment, and the availability of in person appointments.  We also discussed the likely need of an additional face to face encounter for complete and high quality delivery of care.  I also discussed with the patient that there may be a patient responsible charge related to this service. The patient expressed understanding and wishes to proceed.  Provider location is in medical facility. Patient location is at their home, different from provider location. People involved in care of the patient during this telehealth encounter were myself, my nurse/medical assistant, and my front office/scheduling team member.  Review of Systems: No fevers, chills, night sweats, weight loss, chest pain, or shortness of breath.   Objective Findings:    General: Speaking full sentences, no audible heavy breathing.  Sounds alert and appropriately interactive.  Appears well.  Face symmetric.  Extraocular movements intact.  Pupils equal and round.  No nasal flaring or accessory muscle use visualized.  Independent interpretation of tests performed by another provider:   None.  Brief History, Exam, Impression, and Recommendations:    Benign essential hypertension Historically blood pressures have only been elevated in the office, patient has had some dietary indiscretions, he has also started to notice elevated blood pressures at home, upper 130s to 170s systolic. Only taking lisinopril  5, he does not cough as well, switching to valsartan  40, I would like him to touch base with me on MyChart with some blood pressure  readings over the next couple of weeks.  Of note we also filled out [place] disability paperwork today.   I discussed the above assessment and treatment plan with the patient. The patient was provided an opportunity to ask questions and all were answered. The patient agreed with the plan and demonstrated an understanding of the instructions.   The patient was advised to call back or seek an in-person evaluation if the symptoms worsen or if the condition fails to improve as anticipated.   I provided 30 minutes of face to face and non-face-to-face time during this encounter date, time was needed to gather information, review chart, records, communicate/coordinate with staff remotely, as well as complete documentation.   ____________________________________________ Samuel Fox, M.D., ABFM., CAQSM., AME. Primary Care and Sports Medicine [facility]  Adjunct Professor of [facility] Medicine  [employer]  Restaurant manager, fast food

## 2023-11-18 NOTE — Assessment & Plan Note (Addendum)
 Historically blood pressures have only been elevated in the office, patient has had some dietary indiscretions, he has also started to notice elevated blood pressures at home, upper 130s to 170s systolic. Only taking lisinopril  5, he does not cough as well, switching to valsartan  40, I would like him to touch base with me on MyChart with some blood pressure readings over the next couple of weeks.  Of note we also filled out [employer] disability paperwork today.

## 2023-12-09 DIAGNOSIS — M47816 Spondylosis without myelopathy or radiculopathy, lumbar region: Secondary | ICD-10-CM | POA: Diagnosis not present

## 2023-12-15 ENCOUNTER — Encounter: Payer: Self-pay | Admitting: Urgent Care

## 2023-12-16 ENCOUNTER — Encounter: Payer: Self-pay | Admitting: Sports Medicine

## 2023-12-18 ENCOUNTER — Ambulatory Visit: Admitting: Urgent Care

## 2023-12-18 ENCOUNTER — Encounter: Payer: Self-pay | Admitting: Urgent Care

## 2023-12-18 VITALS — BP 114/79 | HR 77 | Resp 20 | Ht 72.0 in | Wt 206.0 lb

## 2023-12-18 DIAGNOSIS — B9689 Other specified bacterial agents as the cause of diseases classified elsewhere: Secondary | ICD-10-CM | POA: Diagnosis not present

## 2023-12-18 DIAGNOSIS — Z23 Encounter for immunization: Secondary | ICD-10-CM

## 2023-12-18 DIAGNOSIS — J019 Acute sinusitis, unspecified: Secondary | ICD-10-CM

## 2023-12-18 DIAGNOSIS — M545 Low back pain, unspecified: Secondary | ICD-10-CM

## 2023-12-18 MED ORDER — COVID-19 MRNA VAC-TRIS(PFIZER) 30 MCG/0.3ML IM SUSY: 0.3000 mL | PREFILLED_SYRINGE | Freq: Once | INTRAMUSCULAR | 0 refills | Status: AC

## 2023-12-18 MED ORDER — DOXYCYCLINE HYCLATE 100 MG PO TABS: 100.0000 mg | ORAL_TABLET | Freq: Two times a day (BID) | ORAL | 0 refills | Status: AC

## 2023-12-18 NOTE — Patient Instructions (Addendum)
 Please start the doxycycline  twice daily for a total of 10 days.  I have completed your return to work paperwork. If you develop any issues returning without modifications, please let me know.  I have sent in a script for your covid vaccine

## 2023-12-18 NOTE — Progress Notes (Signed)
 Established Patient Office Visit  Subjective:  Patient ID: Samuel Fox, male    DOB: [birth year]  Age: 53 y.o. MRN: [id number]  Chief Complaint  Patient presents with   Form Completion    FMLA    HPI  Discussed the use of AI scribe software for clinical note transcription with the patient, who gave verbal consent to proceed.  History of Present Illness   Samuel Fox is a 53 year old male who presents for a work release evaluation.  He has been experiencing midline lower back pain since December of the previous year. He underwent a spinal ablation last week, which was his third round of the procedure. He feels almost as he did before the procedure, with pain primarily noticeable when bending over. He has been participating in physical therapy for seven months. He works in [employer] stage [employer] in [employer], where lifting is a sporadic but necessary part of his job. He is concerned about lifting heavy objects at work and prefers to return without restrictions to avoid negative feedback from his employer.  He underwent an MRI in May, and since July, he has been under the care of [facility]. He is currently taking tizanidine  (Zanaflex ) at night, but not every night. No numbness, tingling in the groin, radiating pain down the legs, or incontinence of bowel or bladder.  He also experiences sinusitis a couple of times a year. Recently, he had a sinus infection that led to acne-like symptoms. He self-medicated with leftover doxycycline  from his girlfriend, which he started taking on Sunday night. He had seven pills and noticed improvement within 24 hours.       Patient Active Problem List   Diagnosis Date Noted   Acute midline low back pain without sciatica 04/25/2023   Right hip pain 08/06/2022   Degenerative disc disease, cervical 08/06/2022   Left eye injury 05/02/2022   Rosacea 07/30/2021   Benign essential hypertension 07/30/2021   Erectile dysfunction  06/12/2018   Left medial knee pain 06/04/2018   Psoriasis 06/04/2018   Annual physical exam 11/24/2017   History of Ventricular bigeminy 11/24/2017   Reactive airway disease 11/24/2017   Past Medical History:  Diagnosis Date   Asthma    Ventricular bigeminy    History reviewed. No pertinent surgical history. Social History   Tobacco Use   Smoking status: Former   Smokeless tobacco: Never  Substance Use Topics   Alcohol use: Yes   Drug use: Never      ROS: as noted in HPI  Objective:     BP 114/79 (BP Location: Right Arm, Patient Position: Sitting, Cuff Size: Normal)   Pulse 77   Resp 20   Ht 6' (1.829 m)   Wt 206 lb (93.4 kg)   SpO2 99%   BMI 27.94 kg/m  BP Readings from Last 3 Encounters:  12/18/23 114/79  02/24/23 (!) 120/90  12/31/22 119/85   Wt Readings from Last 3 Encounters:  12/18/23 206 lb (93.4 kg)  02/24/23 194 lb 8 oz (88.2 kg)  12/31/22 200 lb (90.7 kg)      Physical Exam Vitals and nursing note reviewed.  Constitutional:      General: He is not in acute distress.    Appearance: Normal appearance. He is normal weight. He is not ill-appearing, toxic-appearing or diaphoretic.  HENT:     Head: Normocephalic and atraumatic. No masses.     Jaw: There is normal jaw occlusion. No swelling or pain on movement.  Salivary Glands: Right salivary gland is not diffusely enlarged or tender. Left salivary gland is not diffusely enlarged or tender.     Right Ear: No drainage, swelling or tenderness. A middle ear effusion is present. There is no impacted cerumen. Tympanic membrane is not injected, scarred, perforated or erythematous.     Left Ear: No drainage, swelling or tenderness. A middle ear effusion is present. There is no impacted cerumen. Tympanic membrane is not injected, perforated or erythematous.     Nose: Congestion and rhinorrhea present. Rhinorrhea is purulent.     Right Turbinates: Enlarged and swollen.     Left Turbinates: Enlarged and  swollen.     Right Sinus: Maxillary sinus tenderness and frontal sinus tenderness present.     Left Sinus: Maxillary sinus tenderness and frontal sinus tenderness present.     Mouth/Throat:     Lips: Pink.     Mouth: Mucous membranes are moist.     Pharynx: Oropharynx is clear. Uvula midline. Postnasal drip present. No pharyngeal swelling, oropharyngeal exudate, posterior oropharyngeal erythema or uvula swelling.  Cardiovascular:     Rate and Rhythm: Normal rate and regular rhythm.  Pulmonary:     Effort: Pulmonary effort is normal. No respiratory distress.     Breath sounds: Normal breath sounds. No stridor. No wheezing or rhonchi.  Musculoskeletal:     Cervical back: Normal range of motion and neck supple. No rigidity or tenderness.     Right lower leg: No edema.  Lymphadenopathy:     Cervical: Cervical adenopathy present.  Skin:    General: Skin is warm and dry.     Coloration: Skin is not jaundiced.     Findings: No erythema or rash.     Comments: Pustule to R cheek near nasal bridge  Neurological:     General: No focal deficit present.     Mental Status: He is alert and oriented to person, place, and time.  Psychiatric:        Mood and Affect: Mood normal.        Behavior: Behavior normal.      No results found for any visits on 12/18/23.    The 10-year ASCVD risk score (Arnett DK, et al., 2019) is: 3.2%  Assessment & Plan:  Acute bacterial sinusitis -     Doxycycline  Hyclate; Take 1 tablet (100 mg total) by mouth 2 (two) times daily.  Dispense: 20 tablet; Refill: 0  Acute midline low back pain without sciatica  Need for viral immunization -     COVID-19 mRNA Vac-TriS(Pfizer); Inject 0.3 mLs into the muscle once for 1 dose.  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 No surgical intervention needed. No neurological symptoms. He desires unrestricted work return with awareness of  symptom management. He is able to complete all tasks other than 50-100# lift. - Completed paperwork for unrestricted work release, other than 50-100# lift. - Advise to avoid bending and use squatting techniques.  Acute sinusitis Recurrent sinusitis with current mild episode. Self-medicated with doxycycline , showing improvement. Declined prednisone  due to side effects. - Prescribe doxycycline  for 10 days.  General Health Maintenance Requested COVID-19 vaccination prescription. - Provide prescription for COVID-19 vaccine.         No follow-ups on file.   Benton LITTIE Gave, PA

## 2024-01-01 ENCOUNTER — Encounter: Admitting: Sports Medicine

## 2024-02-02 ENCOUNTER — Encounter: Admitting: Urgent Care

## 2024-02-18 ENCOUNTER — Encounter: Payer: Self-pay | Admitting: Urgent Care

## 2024-02-18 DIAGNOSIS — I10 Essential (primary) hypertension: Secondary | ICD-10-CM

## 2024-02-18 MED ORDER — VALSARTAN 40 MG PO TABS: 40.0000 mg | ORAL_TABLET | Freq: Every day | ORAL | 3 refills | Status: AC

## 2024-03-15 ENCOUNTER — Encounter: Admitting: Urgent Care
